# Patient Record
Sex: Female | Born: 1955 | Race: White | Hispanic: No | Smoking: Current every day smoker
Health system: Southern US, Community
[De-identification: ages and names within clinical notes are randomized; demographics above are authoritative.]

## PROBLEM LIST (undated history)

## (undated) DIAGNOSIS — I10 Essential (primary) hypertension: Secondary | ICD-10-CM

## (undated) DIAGNOSIS — J449 Chronic obstructive pulmonary disease, unspecified: Secondary | ICD-10-CM

## (undated) DIAGNOSIS — F419 Anxiety disorder, unspecified: Secondary | ICD-10-CM

## (undated) DIAGNOSIS — R569 Unspecified convulsions: Secondary | ICD-10-CM

## (undated) DIAGNOSIS — R011 Cardiac murmur, unspecified: Secondary | ICD-10-CM

## (undated) DIAGNOSIS — E7800 Pure hypercholesterolemia, unspecified: Secondary | ICD-10-CM

## (undated) DIAGNOSIS — J45909 Unspecified asthma, uncomplicated: Secondary | ICD-10-CM

## (undated) DIAGNOSIS — B009 Herpesviral infection, unspecified: Secondary | ICD-10-CM

## (undated) HISTORY — DX: Anxiety disorder, unspecified: F41.9

## (undated) HISTORY — PX: PARTIAL HYSTERECTOMY: SHX80

## (undated) HISTORY — PX: OTHER SURGICAL HISTORY: SHX169

## (undated) HISTORY — DX: Herpesviral infection, unspecified: B00.9

## (undated) HISTORY — DX: Chronic obstructive pulmonary disease, unspecified: J44.9

## (undated) HISTORY — PX: WISDOM TOOTH EXTRACTION: SHX21

## (undated) HISTORY — PX: TONSILLECTOMY AND ADENOIDECTOMY: SHX28

## (undated) HISTORY — DX: Cardiac murmur, unspecified: R01.1

## (undated) HISTORY — DX: Essential (primary) hypertension: I10

## (undated) HISTORY — DX: Unspecified convulsions: R56.9

## (undated) HISTORY — DX: Pure hypercholesterolemia, unspecified: E78.00

## (undated) HISTORY — DX: Unspecified asthma, uncomplicated: J45.909

---

## 1997-10-31 ENCOUNTER — Emergency Department (HOSPITAL_COMMUNITY): Admission: EM | Admit: 1997-10-31 | Discharge: 1997-10-31 | Payer: Self-pay | Admitting: Endocrinology

## 1997-11-02 ENCOUNTER — Encounter: Admission: RE | Admit: 1997-11-02 | Discharge: 1998-01-31 | Payer: Self-pay | Admitting: Internal Medicine

## 1997-11-02 ENCOUNTER — Encounter: Admission: RE | Admit: 1997-11-02 | Discharge: 1997-11-02 | Payer: Self-pay | Admitting: *Deleted

## 1997-12-04 ENCOUNTER — Encounter: Admission: RE | Admit: 1997-12-04 | Discharge: 1998-03-04 | Payer: Self-pay | Admitting: Specialist

## 2013-09-16 LAB — HM COLONOSCOPY

## 2015-12-14 DIAGNOSIS — J449 Chronic obstructive pulmonary disease, unspecified: Secondary | ICD-10-CM | POA: Diagnosis not present

## 2016-01-13 DIAGNOSIS — J449 Chronic obstructive pulmonary disease, unspecified: Secondary | ICD-10-CM | POA: Diagnosis not present

## 2016-01-20 DIAGNOSIS — I10 Essential (primary) hypertension: Secondary | ICD-10-CM | POA: Diagnosis not present

## 2016-01-20 DIAGNOSIS — M542 Cervicalgia: Secondary | ICD-10-CM | POA: Diagnosis not present

## 2016-02-13 DIAGNOSIS — J449 Chronic obstructive pulmonary disease, unspecified: Secondary | ICD-10-CM | POA: Diagnosis not present

## 2016-03-14 DIAGNOSIS — J449 Chronic obstructive pulmonary disease, unspecified: Secondary | ICD-10-CM | POA: Diagnosis not present

## 2016-03-21 DIAGNOSIS — F17210 Nicotine dependence, cigarettes, uncomplicated: Secondary | ICD-10-CM | POA: Diagnosis not present

## 2016-03-21 DIAGNOSIS — I10 Essential (primary) hypertension: Secondary | ICD-10-CM | POA: Diagnosis not present

## 2016-03-21 DIAGNOSIS — Z23 Encounter for immunization: Secondary | ICD-10-CM | POA: Diagnosis not present

## 2016-03-21 DIAGNOSIS — R7301 Impaired fasting glucose: Secondary | ICD-10-CM | POA: Diagnosis not present

## 2016-03-21 DIAGNOSIS — E785 Hyperlipidemia, unspecified: Secondary | ICD-10-CM | POA: Diagnosis not present

## 2016-04-14 DIAGNOSIS — J449 Chronic obstructive pulmonary disease, unspecified: Secondary | ICD-10-CM | POA: Diagnosis not present

## 2016-09-21 DIAGNOSIS — F3340 Major depressive disorder, recurrent, in remission, unspecified: Secondary | ICD-10-CM | POA: Diagnosis not present

## 2016-09-21 DIAGNOSIS — Z0000 Encounter for general adult medical examination without abnormal findings: Secondary | ICD-10-CM | POA: Diagnosis not present

## 2016-09-21 DIAGNOSIS — R5383 Other fatigue: Secondary | ICD-10-CM | POA: Diagnosis not present

## 2016-09-21 DIAGNOSIS — Z13220 Encounter for screening for lipoid disorders: Secondary | ICD-10-CM | POA: Diagnosis not present

## 2016-09-21 DIAGNOSIS — G40909 Epilepsy, unspecified, not intractable, without status epilepticus: Secondary | ICD-10-CM | POA: Diagnosis not present

## 2016-09-21 DIAGNOSIS — R7301 Impaired fasting glucose: Secondary | ICD-10-CM | POA: Diagnosis not present

## 2016-09-21 LAB — COMPLETE METABOLIC PANEL WITH GFR
CO2: 30
Calcium: 10.3
Chloride: 100
Total Protein: 7
eGFR: >60

## 2016-09-21 LAB — BASIC METABOLIC PANEL
BUN: 14 (ref 4–21)
CREATININE: 0.6 (ref 0.5–1.1)
GLUCOSE: 86
POTASSIUM: 4.5 (ref 3.4–5.3)
Sodium: 141 (ref 137–147)

## 2016-09-21 LAB — HEPATIC FUNCTION PANEL
ALT: 16 (ref 7–35)
AST: 17 (ref 13–35)
Alkaline Phosphatase: 75 (ref 25–125)
BILIRUBIN, TOTAL: 0.9

## 2016-09-21 LAB — URINALYSIS COMPLETE WITH MICROSCOPIC (ARMC ONLY)
BILIRUBIN (URINE): NEGATIVE
Ketones, urine: NEGATIVE
Protein, Urine: NEGATIVE
SPECIFIC GRAVITY: 1.015
URINE GLUCOSE: NEGATIVE
pH, Urine: 7

## 2016-09-21 LAB — CBC AND DIFFERENTIAL
HCT: 45 (ref 36–46)
HEMOGLOBIN: 15.8 (ref 12.0–16.0)
Platelets: 276 (ref 150–399)
WBC: 8.9

## 2016-09-21 LAB — LIPID PANEL
Cholesterol: 134 (ref 0–200)
HDL: 45 (ref 35–70)
LDL Cholesterol: 66
Triglycerides: 116 (ref 40–160)

## 2016-09-21 LAB — HEMOGLOBIN A1C: HEMOGLOBIN A1C: 5.7

## 2016-09-28 DIAGNOSIS — M1711 Unilateral primary osteoarthritis, right knee: Secondary | ICD-10-CM | POA: Diagnosis not present

## 2016-09-28 DIAGNOSIS — G8929 Other chronic pain: Secondary | ICD-10-CM | POA: Diagnosis not present

## 2016-09-28 DIAGNOSIS — M25561 Pain in right knee: Secondary | ICD-10-CM | POA: Diagnosis not present

## 2017-01-19 DIAGNOSIS — R300 Dysuria: Secondary | ICD-10-CM | POA: Diagnosis not present

## 2017-01-19 DIAGNOSIS — L02219 Cutaneous abscess of trunk, unspecified: Secondary | ICD-10-CM | POA: Diagnosis not present

## 2017-01-19 DIAGNOSIS — Z23 Encounter for immunization: Secondary | ICD-10-CM | POA: Diagnosis not present

## 2017-01-19 DIAGNOSIS — I10 Essential (primary) hypertension: Secondary | ICD-10-CM | POA: Diagnosis not present

## 2017-01-19 DIAGNOSIS — N952 Postmenopausal atrophic vaginitis: Secondary | ICD-10-CM | POA: Diagnosis not present

## 2017-06-15 DIAGNOSIS — I10 Essential (primary) hypertension: Secondary | ICD-10-CM | POA: Diagnosis not present

## 2017-06-15 DIAGNOSIS — H6122 Impacted cerumen, left ear: Secondary | ICD-10-CM | POA: Diagnosis not present

## 2017-06-15 DIAGNOSIS — R6889 Other general symptoms and signs: Secondary | ICD-10-CM | POA: Diagnosis not present

## 2017-10-03 ENCOUNTER — Encounter: Payer: Self-pay | Admitting: Physician Assistant

## 2017-10-03 ENCOUNTER — Ambulatory Visit (INDEPENDENT_AMBULATORY_CARE_PROVIDER_SITE_OTHER): Payer: BLUE CROSS/BLUE SHIELD | Admitting: Physician Assistant

## 2017-10-03 VITALS — BP 107/65 | HR 76 | Temp 98.3°F | Resp 16 | Ht 67.75 in | Wt 242.0 lb

## 2017-10-03 DIAGNOSIS — R0602 Shortness of breath: Secondary | ICD-10-CM | POA: Diagnosis not present

## 2017-10-03 DIAGNOSIS — F339 Major depressive disorder, recurrent, unspecified: Secondary | ICD-10-CM

## 2017-10-03 DIAGNOSIS — E559 Vitamin D deficiency, unspecified: Secondary | ICD-10-CM

## 2017-10-03 DIAGNOSIS — I10 Essential (primary) hypertension: Secondary | ICD-10-CM

## 2017-10-03 DIAGNOSIS — Z0000 Encounter for general adult medical examination without abnormal findings: Secondary | ICD-10-CM | POA: Diagnosis not present

## 2017-10-03 DIAGNOSIS — Z8249 Family history of ischemic heart disease and other diseases of the circulatory system: Secondary | ICD-10-CM | POA: Diagnosis not present

## 2017-10-03 DIAGNOSIS — E7800 Pure hypercholesterolemia, unspecified: Secondary | ICD-10-CM | POA: Diagnosis not present

## 2017-10-03 DIAGNOSIS — J42 Unspecified chronic bronchitis: Secondary | ICD-10-CM

## 2017-10-03 DIAGNOSIS — E669 Obesity, unspecified: Secondary | ICD-10-CM

## 2017-10-03 DIAGNOSIS — J449 Chronic obstructive pulmonary disease, unspecified: Secondary | ICD-10-CM | POA: Insufficient documentation

## 2017-10-03 DIAGNOSIS — M79605 Pain in left leg: Secondary | ICD-10-CM

## 2017-10-03 DIAGNOSIS — R600 Localized edema: Secondary | ICD-10-CM

## 2017-10-03 DIAGNOSIS — D369 Benign neoplasm, unspecified site: Secondary | ICD-10-CM | POA: Diagnosis not present

## 2017-10-03 DIAGNOSIS — R0601 Orthopnea: Secondary | ICD-10-CM

## 2017-10-03 DIAGNOSIS — J302 Other seasonal allergic rhinitis: Secondary | ICD-10-CM

## 2017-10-03 DIAGNOSIS — H6123 Impacted cerumen, bilateral: Secondary | ICD-10-CM | POA: Diagnosis not present

## 2017-10-03 DIAGNOSIS — Z720 Tobacco use: Secondary | ICD-10-CM

## 2017-10-03 MED ORDER — VARENICLINE TARTRATE 0.5 MG X 11 & 1 MG X 42 PO MISC: ORAL | 0 refills | Status: DC

## 2017-10-03 MED ORDER — FLUTICASONE-UMECLIDIN-VILANT 100-62.5-25 MCG/INH IN AEPB: 1.0000 | INHALATION_SPRAY | Freq: Every day | RESPIRATORY_TRACT | 2 refills | Status: DC

## 2017-10-03 NOTE — Patient Instructions (Addendum)
Echo/labs  Stop symbicort and start trelegy once daily.   Start chantix.   CT scan low dose screening.will hold on.   Debrox 5-10 drops each ear.

## 2017-10-03 NOTE — Progress Notes (Signed)
Subjective:    Patient ID: Ashley Scott, female    DOB: 11-04-1955, 62 y.o.   MRN: [id number]  HPI  Pt is a 62 yo obese female with COPD, MDD, HTN who presents to the clinic to establish care and discuss concerns.   She does want to quit smoking. She has tried chantix in the past and worked well with no side effects. She became stressed one day and started smoking back. She has smoked for at least 42 years at least 1 pack a day. Her COPD is not necessarily controlled. She get SOB easily. She has trouble going to [employer]/shopping without getting easily tired. She is taking symbicort and does help. She is having some new intermittent swelling or bilateral lower extremities. She has a family hx with mother and father with CHF. She admits to sleeping on multiple pillows at night. Overall she has problems going to sleep as well. Hemp oil helps a lot.   Pt mentions some cold, numbing sensations in the leg intermittently but mostly at night. Feels like "cold water being poured on the lateral outside of leg". Mostly right leg but left as well. She admits to cramps.   She does feel like something is draining out of her ears and they feel clogged.   .. Active Ambulatory Problems    Diagnosis Date Noted  . COPD (chronic obstructive pulmonary disease) ([place]) 10/03/2017  . Hypercholesteremia 10/03/2017  . Adenomatous polyp 10/03/2017  . Family history of heart failure 10/04/2017  . SOB (shortness of breath) on exertion 10/04/2017  . Tobacco abuse 10/04/2017  . Bilateral impacted cerumen 10/04/2017  . Vitamin D insufficiency 10/04/2017  . Essential hypertension 10/04/2017  . Depression, recurrent ([place]) 10/04/2017  . Seasonal allergies 10/04/2017  . Obesity (BMI 30-39.9) 10/04/2017   Resolved Ambulatory Problems    Diagnosis Date Noted  . No Resolved Ambulatory Problems   Past Medical History:  Diagnosis Date  . Anxiety   . Asthma   . COPD (chronic obstructive pulmonary disease) ([place])   .  Herpes   . Hypercholesteremia   . Hypertension   . Seizure-like activity ([place])    .Marland Kitchen Family History  Problem Relation Age of Onset  . Stomach cancer Mother   . Brain cancer Mother   . Hypertension Mother   . Hypercholesterolemia Mother   . Hypercholesterolemia Father   . Diabetes Father   . Hypertension Father   . Liver disease Sister   . Heart attack Maternal Grandfather   . Brain cancer Maternal Grandfather   . Breast cancer Maternal Aunt    .Marland Kitchen Social History   Socioeconomic History  . Marital status: Divorced    Spouse name: Not on file  . Number of children: Not on file  . Years of education: Not on file  . Highest education level: Not on file  Occupational History  . Not on file  Social Needs  . Financial resource strain: Not on file  . Food insecurity:    Worry: Not on file    Inability: Not on file  . Transportation needs:    Medical: Not on file    Non-medical: Not on file  Tobacco Use  . Smoking status: Current Every Day Smoker    Packs/day: 1.00    Types: Cigarettes  . Smokeless tobacco: Never Used  Substance and Sexual Activity  . Alcohol use: Yes    Comment: very rarely  . Drug use: Never  . Sexual activity: Not Currently  Lifestyle  .  Physical activity:    Days per week: Not on file    Minutes per session: Not on file  . Stress: Not on file  Relationships  . Social connections:    Talks on phone: Not on file    Gets together: Not on file    Attends religious service: Not on file    Active member of club or organization: Not on file    Attends meetings of clubs or organizations: Not on file    Relationship status: Not on file  . Intimate partner violence:    Fear of current or ex partner: Not on file    Emotionally abused: Not on file    Physically abused: Not on file    Forced sexual activity: Not on file  Other Topics Concern  . Not on file  Social History Narrative  . Not on file     Review of Systems See HPI.     Objective:    Physical Exam  Constitutional: She is oriented to person, place, and time. She appears well-developed and well-nourished.  HENT:  Head: Normocephalic and atraumatic.  Nose: Nose normal.  Mouth/Throat: Oropharynx is clear and moist.  Bilateral cerumen impaction.   Eyes: Conjunctivae and EOM are normal.  Neck: Normal range of motion.  Cardiovascular: Normal rate and regular rhythm.  Pulmonary/Chest: Effort normal.  Coarse breath sounds bilateral lungs.   Neurological: She is alert and oriented to person, place, and time.  Skin:  Bilateral scant edema.           Assessment & Plan:   .Ashley Scott was seen today for new patient (initial visit), requests chantix medication and possible med refills.  Diagnoses and all orders for this visit:  Chronic bronchitis, unspecified chronic bronchitis type ([place]) -     Fluticasone-Umeclidin-Vilant (TRELEGY ELLIPTA) 100-62.5-25 MCG/INH AEPB; Inhale 1 puff into the lungs daily.  Hypercholesteremia -     Lipid Panel w/reflex Direct LDL  Family history of heart failure -     Brain natriuretic peptide  SOB (shortness of breath) on exertion -     COMPLETE METABOLIC PANEL WITH GFR -     Brain natriuretic peptide  Adenomatous polyp  Preventative health care -     COMPLETE METABOLIC PANEL WITH GFR -     Brain natriuretic peptide -     Lipid Panel w/reflex Direct LDL -     TSH  Tobacco abuse -     varenicline (CHANTIX STARTING MONTH PAK) 0.5 MG X 11 & 1 MG X 42 tablet; Take one 0.5mg  tablet by mouth once daily for 3 days, then increase to one 0.5mg  tablet twice daily for 3 days, then increase to one 1mg  tablet twice daily.  Bilateral impacted cerumen  Essential hypertension  Vitamin D insufficiency  Depression, recurrent ([facility])  Seasonal allergies   .Marland Kitchen Depression screen PHQ 2/9 10/08/2017  Decreased Interest 2  Down, Depressed, Hopeless 2  PHQ - 2 Score 4  Altered sleeping 3  Tired, decreased energy 3  Change in appetite 1   Feeling bad or failure about yourself  2  Trouble concentrating 1  Moving slowly or fidgety/restless 0  Suicidal thoughts 0  PHQ-9 Score 14  Difficult doing work/chores Somewhat difficult   Started chantix to quit smoking. Start the taper and be stopped smoking in 1 month. Will likely continue chantix for 3-6 months.  Stop symbicort and start trelegy to see if improves cOPD symptoms. consider spirometry in the future.    Due  to SOB/bilateral lower ext/edema/orthopnea will get ECHO and BNP.   Left leg pain could be neuropathy/radicular pain or perhaps could be some PVD will hx. Will continue to work up. Lipid ordered today.   Pt declined irrigation today. Discussed debrox to use for next few weeks and let me know if has any problems.   Discussed may need close follow up over the next 6 months to get her in a better place.

## 2017-10-04 ENCOUNTER — Encounter: Payer: Self-pay | Admitting: Physician Assistant

## 2017-10-04 DIAGNOSIS — R0602 Shortness of breath: Secondary | ICD-10-CM | POA: Insufficient documentation

## 2017-10-04 DIAGNOSIS — I10 Essential (primary) hypertension: Secondary | ICD-10-CM | POA: Insufficient documentation

## 2017-10-04 DIAGNOSIS — F17200 Nicotine dependence, unspecified, uncomplicated: Secondary | ICD-10-CM | POA: Insufficient documentation

## 2017-10-04 DIAGNOSIS — Z8249 Family history of ischemic heart disease and other diseases of the circulatory system: Secondary | ICD-10-CM | POA: Insufficient documentation

## 2017-10-04 DIAGNOSIS — J302 Other seasonal allergic rhinitis: Secondary | ICD-10-CM | POA: Insufficient documentation

## 2017-10-04 DIAGNOSIS — E559 Vitamin D deficiency, unspecified: Secondary | ICD-10-CM | POA: Insufficient documentation

## 2017-10-04 DIAGNOSIS — H6123 Impacted cerumen, bilateral: Secondary | ICD-10-CM | POA: Insufficient documentation

## 2017-10-04 DIAGNOSIS — E669 Obesity, unspecified: Secondary | ICD-10-CM | POA: Insufficient documentation

## 2017-10-04 DIAGNOSIS — F339 Major depressive disorder, recurrent, unspecified: Secondary | ICD-10-CM | POA: Insufficient documentation

## 2017-10-04 LAB — COMPLETE METABOLIC PANEL WITH GFR
AG RATIO: 2 (calc) (ref 1.0–2.5)
ALT: 13 U/L (ref 6–29)
AST: 16 U/L (ref 10–35)
Albumin: 4.3 g/dL (ref 3.6–5.1)
Alkaline phosphatase (APISO): 64 U/L (ref 33–130)
BILIRUBIN TOTAL: 0.6 mg/dL (ref 0.2–1.2)
BUN: 8 mg/dL (ref 7–25)
CHLORIDE: 100 mmol/L (ref 98–110)
CO2: 30 mmol/L (ref 20–32)
Calcium: 9.6 mg/dL (ref 8.6–10.4)
Creat: 0.65 mg/dL (ref 0.50–0.99)
GFR, EST AFRICAN AMERICAN: 110 mL/min/{1.73_m2} (ref >=60)
GFR, Est Non African American: 95 mL/min/{1.73_m2} (ref >=60)
Globulin: 2.2 g/dL (calc) (ref 1.9–3.7)
Glucose, Bld: 104 mg/dL — ABNORMAL HIGH (ref 65–99)
POTASSIUM: 4.5 mmol/L (ref 3.5–5.3)
SODIUM: 136 mmol/L (ref 135–146)
TOTAL PROTEIN: 6.5 g/dL (ref 6.1–8.1)

## 2017-10-04 LAB — TSH: TSH: 1.11 mIU/L (ref 0.40–4.50)

## 2017-10-04 LAB — LIPID PANEL W/REFLEX DIRECT LDL
CHOLESTEROL: 110 mg/dL (ref <200)
HDL: 40 mg/dL — AB (ref >50)
LDL Cholesterol (Calc): 55 mg/dL (calc)
NON-HDL CHOLESTEROL (CALC): 70 mg/dL (ref <130)
TRIGLYCERIDES: 72 mg/dL (ref <150)
Total CHOL/HDL Ratio: 2.8 (calc) (ref <5.0)

## 2017-10-04 LAB — BRAIN NATRIURETIC PEPTIDE: BRAIN NATRIURETIC PEPTIDE: 40 pg/mL (ref <100)

## 2017-10-05 NOTE — Progress Notes (Signed)
Call pt: fasting glucose a little elevated. We can do an a1c at next visit.  BNP normal.  Cholesterol looks good. HDL could be better. Stopping smoking and exercise could help with this.  Thyroid normal.

## 2017-10-08 DIAGNOSIS — M79605 Pain in left leg: Secondary | ICD-10-CM | POA: Insufficient documentation

## 2017-10-08 DIAGNOSIS — R0601 Orthopnea: Secondary | ICD-10-CM | POA: Insufficient documentation

## 2017-10-08 DIAGNOSIS — R600 Localized edema: Secondary | ICD-10-CM | POA: Insufficient documentation

## 2017-10-09 ENCOUNTER — Encounter: Payer: Self-pay | Admitting: Physician Assistant

## 2017-10-11 ENCOUNTER — Ambulatory Visit (HOSPITAL_BASED_OUTPATIENT_CLINIC_OR_DEPARTMENT_OTHER)
Admission: RE | Admit: 2017-10-11 | Discharge: 2017-10-11 | Disposition: A | Discharge status: Home / Self Care | Payer: BLUE CROSS/BLUE SHIELD | Source: Ambulatory Visit | Attending: Physician Assistant | Admitting: Physician Assistant

## 2017-10-11 DIAGNOSIS — I10 Essential (primary) hypertension: Secondary | ICD-10-CM | POA: Diagnosis not present

## 2017-10-11 DIAGNOSIS — R0601 Orthopnea: Secondary | ICD-10-CM

## 2017-10-11 DIAGNOSIS — Z8249 Family history of ischemic heart disease and other diseases of the circulatory system: Secondary | ICD-10-CM

## 2017-10-11 DIAGNOSIS — R0602 Shortness of breath: Secondary | ICD-10-CM | POA: Diagnosis not present

## 2017-10-11 DIAGNOSIS — J449 Chronic obstructive pulmonary disease, unspecified: Secondary | ICD-10-CM | POA: Insufficient documentation

## 2017-10-11 DIAGNOSIS — Z720 Tobacco use: Secondary | ICD-10-CM | POA: Diagnosis not present

## 2017-10-11 DIAGNOSIS — J42 Unspecified chronic bronchitis: Secondary | ICD-10-CM

## 2017-10-11 NOTE — Progress Notes (Signed)
  Echocardiogram 2D Echocardiogram has been performed.  Ashley Scott 10/11/2017, 4:00 PM

## 2017-10-16 ENCOUNTER — Encounter: Payer: Self-pay | Admitting: Physician Assistant

## 2017-10-16 DIAGNOSIS — I071 Rheumatic tricuspid insufficiency: Secondary | ICD-10-CM | POA: Insufficient documentation

## 2017-10-16 NOTE — Progress Notes (Signed)
Call pt: EF is great. Even more than we usually see. You do have some trivial regurgitation of the tricuspid valve and some left ventricle relaxation.   Overall echo looks good.

## 2017-11-02 ENCOUNTER — Encounter: Payer: Self-pay | Admitting: Physician Assistant

## 2017-11-02 ENCOUNTER — Ambulatory Visit (INDEPENDENT_AMBULATORY_CARE_PROVIDER_SITE_OTHER): Payer: BLUE CROSS/BLUE SHIELD | Admitting: Physician Assistant

## 2017-11-02 VITALS — BP 109/79 | HR 80 | Ht 67.75 in | Wt 240.0 lb

## 2017-11-02 DIAGNOSIS — Z1231 Encounter for screening mammogram for malignant neoplasm of breast: Secondary | ICD-10-CM

## 2017-11-02 DIAGNOSIS — N3281 Overactive bladder: Secondary | ICD-10-CM | POA: Diagnosis not present

## 2017-11-02 DIAGNOSIS — F17200 Nicotine dependence, unspecified, uncomplicated: Secondary | ICD-10-CM

## 2017-11-02 DIAGNOSIS — Z23 Encounter for immunization: Secondary | ICD-10-CM

## 2017-11-02 DIAGNOSIS — Z0000 Encounter for general adult medical examination without abnormal findings: Secondary | ICD-10-CM | POA: Diagnosis not present

## 2017-11-02 DIAGNOSIS — E6609 Other obesity due to excess calories: Secondary | ICD-10-CM

## 2017-11-02 DIAGNOSIS — Z6835 Body mass index (BMI) 35.0-35.9, adult: Secondary | ICD-10-CM

## 2017-11-02 MED ORDER — VARENICLINE TARTRATE 1 MG PO TABS: 1.0000 mg | ORAL_TABLET | Freq: Two times a day (BID) | ORAL | 2 refills | Status: DC

## 2017-11-02 MED ORDER — MIRABEGRON ER 50 MG PO TB24: 50.0000 mg | ORAL_TABLET | Freq: Every day | ORAL | 5 refills | Status: DC

## 2017-11-02 NOTE — Progress Notes (Signed)
Subjective:     Ashley Scott is a 62 y.o. female here for a physical routine.  Personal health questionnaire reviewed: yes.   Current complaints:  COPD: SOB has improved much better since last visit on 10/03/2017. Pt was on symbicort and was having SOB w/ walking. She was put on trelegy and reports significant improved in her breathing with ambulation. No side effects or concerns.   Urge and stress urinary Incontinence: She says this has been going for years, however it is getting worse. Had a bladder scan 2 yrs ago and was normal. Denies heamturia, abdominal pain or any other symptoms. Had tried kegel exercises but have not been helpful. She has to get up several times to get up to use the bathroom. She is interested in trying medicine for this. She has been using pads but has made it hard to work.   R. Groin pain: Pt has been having intermittent R. Groin pain for 1 month. It has happened 3 times. It is a sharp pain that lasts seconds. No radiation of pain. No aggravating or alleviating factors. No pain w/ ambulation or palpation. She has no tried anything for this. Denies any trauma or skin changes. Pt has a hx of OA of both knees.   Smoking: Have gone down to 1/2 pack a day of smoking when on Chantix. Pt has been on Chantix for 1 month. No side effects.  Gynecologic History No LMP recorded. Last mammogram: 11/05/2014. Results were: normal  Obstetric History OB History  No data available   The following portions of the patient's history were reviewed and updated as appropriate: allergies, past social history and problem list.  Review of Systems Constitutional: negative Eyes: negative Ears, nose, mouth, throat, and face: negative Respiratory: negative Cardiovascular: negative Gastrointestinal: positive for constipation Genitourinary:positive for urinary incontinence Integument/breast: negative Musculoskeletal:positive for myalgias Neurological: negative Behavioral/Psych: negative     Objective:    BP 109/79   Pulse 80   Ht 5' 7.75" (1.721 m)   Wt 240 lb (108.9 kg)   BMI 36.76 kg/m   General Appearance:    Alert, cooperative, no distress, appears stated age  Head:    Normocephalic, without obvious abnormality, atraumatic  Eyes:    PERRL, conjunctiva/corneas clear, EOM's intact, fundi    benign, both eyes  Ears:    Normal TM's and external ear canals, both ears. Mild cerumen bilateral  Nose:   Nares normal, septum midline, mucosa normal, no drainage    or sinus tenderness  Throat:   Lips, mucosa, and tongue normal; teeth and gums normal  Neck:   Supple, symmetrical, trachea midline, no adenopathy;    thyroid:  no enlargement/tenderness/nodules; no carotid   bruit or JVD  Back:     Symmetric, no curvature, ROM normal, no CVA tenderness  Lungs:     Mild expiratory wheezing, respirations unlabored  Chest Wall:    No tenderness or deformity   Heart:    Regular rate and rhythm, S1 and S2 normal, no murmur, rub   or gallop     Abdomen:     Soft, non-tender, bowel sounds active all four quadrants,    no masses, no organomegaly        Extremities:  Varicose veins noted on bilateral legs, no cyanosis, mild +1 edema noted of right lower extremity. ROM of hip normal without pain.   Pulses:   2+ and symmetric all extremities  Skin:   Skin color, texture, turgor normal   Lymph nodes:  Cervical, supraclavicular, and axillary nodes normal  Neurologic:   CNII-XII intact, normal strength, sensation and reflexes    throughout      Assessment:    Healthy female exam.    Plan:   [name] was seen today for annual exam.  Diagnoses and all orders for this visit:  Routine physical examination  Visit for screening mammogram -     MM 3D SCREEN BREAST BILATERAL  Need for tetanus booster  Need for Tdap vaccination -     Tdap vaccine greater than or equal to 7yo IM  OAB (overactive bladder) -     mirabegron ER (MYRBETRIQ) 50 MG TB24 tablet; Take 1 tablet (50 mg total)  by mouth daily.  Current smoker -     varenicline (CHANTIX) 1 MG tablet; Take 1 tablet (1 mg total) by mouth 2 (two) times daily.   Vitals:   11/02/17 0906  BP: 109/79  Pulse: 80  Weight: 240 lb (108.9 kg)  Height: 5' 7.75" (1.721 m)   -Vitals look great. Pt has lost 2 lbs. Congratulated for this. Uptodate on labs. Labs looked good. Last done 10/2017. Fasting glucose was mildy elevated. Will do an A1c during 3 month follow. Pt declined A1c at this time. Will follow up in 3 months. Will get A1c and Hep c test during that visit. Encouraged to limit carbs/sweets. Encouraged 150 min of excersice daily. Drink 1.5 oz of water daily.   -Urinary incontinence- Pt encouraged to continue doing kegel exercises. Will order Mirabergron due to least side effect profile. Side effect discussed. Pt was on medication for this before but unsure of what medicine. Will follow up at DM appt.   -Right groin pain-  Likely arthritis vs myalgia. No pain w/ ambulation or tenderness to palpation. No trauma. Hx of OA of bilateral knees. Hip exercises HO given. Pt declined anti-inflammatories for now. Wants to try tumeric. Will follow up as needed for this.  -COPD- Pt doing really well on Trelegy. SOB improved w/ walking and exercise.   Constipation- Encouraged to drink 1.5 oz water daily. Increase fiber in diet. Metamucil and probiotic discussed. No hematochezia or abdominal pain.   Smoking- Pt told to try to stop smoking within 1-2 weeks. Goal stop smoking within 1 month of chantix. Pt voiced understanding. Has cut smoking to 1/2 ppd.   -Tetanus done. Mammogram ordered. Uptodate on colonscopy.

## 2017-11-02 NOTE — Patient Instructions (Addendum)
consider tumeric  Health Maintenance, Female Adopting a healthy lifestyle and getting preventive care can go a long way to promote health and wellness. Talk with your health care provider about what schedule of regular examinations is right for you. This is a good chance for you to check in with your provider about disease prevention and staying healthy. In between checkups, there are plenty of things you can do on your own. Experts have done a lot of research about which lifestyle changes and preventive measures are most likely to keep you healthy. Ask your health care provider for more information. Weight and diet Eat a healthy diet  Be sure to include plenty of vegetables, fruits, low-fat dairy products, and lean protein.  Do not eat a lot of foods high in solid fats, added sugars, or salt.  Get regular exercise. This is one of the most important things you can do for your health. ? Most adults should exercise for at least 150 minutes each week. The exercise should increase your heart rate and make you sweat (moderate-intensity exercise). ? Most adults should also do strengthening exercises at least twice a week. This is in addition to the moderate-intensity exercise.  Maintain a healthy weight  Body mass index (BMI) is a measurement that can be used to identify possible weight problems. It estimates body fat based on height and weight. Your health care provider can help determine your BMI and help you achieve or maintain a healthy weight.  For females 77 years of age and older: ? A BMI below 18.5 is considered underweight. ? A BMI of 18.5 to 24.9 is normal. ? A BMI of 25 to 29.9 is considered overweight. ? A BMI of 30 and above is considered obese.  Watch levels of cholesterol and blood lipids  You should start having your blood tested for lipids and cholesterol at 62 years of age, then have this test every 5 years.  You may need to have your cholesterol levels checked more often  if: ? Your lipid or cholesterol levels are high. ? You are older than 62 years of age. ? You are at high risk for heart disease.  Cancer screening Lung Cancer  Lung cancer screening is recommended for adults 6-73 years old who are at high risk for lung cancer because of a history of smoking.  A yearly low-dose CT scan of the lungs is recommended for people who: ? Currently smoke. ? Have quit within the past 15 years. ? Have at least a 30-pack-year history of smoking. A pack year is smoking an average of one pack of cigarettes a day for 1 year.  Yearly screening should continue until it has been 15 years since you quit.  Yearly screening should stop if you develop a health problem that would prevent you from having lung cancer treatment.  Breast Cancer  Practice breast self-awareness. This means understanding how your breasts normally appear and feel.  It also means doing regular breast self-exams. Let your health care provider know about any changes, no matter how small.  If you are in your 20s or 30s, you should have a clinical breast exam (CBE) by a health care provider every 1-3 years as part of a regular health exam.  If you are 32 or older, have a CBE every year. Also consider having a breast X-ray (mammogram) every year.  If you have a family history of breast cancer, talk to your health care provider about genetic screening.  If you are  at high risk for breast cancer, talk to your health care provider about having an MRI and a mammogram every year.  Breast cancer gene (BRCA) assessment is recommended for women who have family members with BRCA-related cancers. BRCA-related cancers include: ? Breast. ? Ovarian. ? Tubal. ? Peritoneal cancers.  Results of the assessment will determine the need for genetic counseling and BRCA1 and BRCA2 testing.  Cervical Cancer Your health care provider may recommend that you be screened regularly for cancer of the pelvic organs  (ovaries, uterus, and vagina). This screening involves a pelvic examination, including checking for microscopic changes to the surface of your cervix (Pap test). You may be encouraged to have this screening done every 3 years, beginning at age 74.  For women ages 66-65, health care providers may recommend pelvic exams and Pap testing every 3 years, or they may recommend the Pap and pelvic exam, combined with testing for human papilloma virus (HPV), every 5 years. Some types of HPV increase your risk of cervical cancer. Testing for HPV may also be done on women of any age with unclear Pap test results.  Other health care providers may not recommend any screening for nonpregnant women who are considered low risk for pelvic cancer and who do not have symptoms. Ask your health care provider if a screening pelvic exam is right for you.  If you have had past treatment for cervical cancer or a condition that could lead to cancer, you need Pap tests and screening for cancer for at least 20 years after your treatment. If Pap tests have been discontinued, your risk factors (such as having a new sexual partner) need to be reassessed to determine if screening should resume. Some women have medical problems that increase the chance of getting cervical cancer. In these cases, your health care provider may recommend more frequent screening and Pap tests.  Colorectal Cancer  This type of cancer can be detected and often prevented.  Routine colorectal cancer screening usually begins at 62 years of age and continues through 62 years of age.  Your health care provider may recommend screening at an earlier age if you have risk factors for colon cancer.  Your health care provider may also recommend using home test kits to check for hidden blood in the stool.  A small camera at the end of a tube can be used to examine your colon directly (sigmoidoscopy or colonoscopy). This is done to check for the earliest forms of  colorectal cancer.  Routine screening usually begins at age 38.  Direct examination of the colon should be repeated every 5-10 years through 62 years of age. However, you may need to be screened more often if early forms of precancerous polyps or small growths are found.  Skin Cancer  Check your skin from head to toe regularly.  Tell your health care provider about any new moles or changes in moles, especially if there is a change in a mole's shape or color.  Also tell your health care provider if you have a mole that is larger than the size of a pencil eraser.  Always use sunscreen. Apply sunscreen liberally and repeatedly throughout the day.  Protect yourself by wearing long sleeves, pants, a wide-brimmed hat, and sunglasses whenever you are outside.  Heart disease, diabetes, and high blood pressure  High blood pressure causes heart disease and increases the risk of stroke. High blood pressure is more likely to develop in: ? People who have blood pressure in the  in the high end of the normal range (130-139/85-89 mm Hg). ? People who are overweight or obese. ? People who are African American.  If you are 18-39 years of age, have your blood pressure checked every 3-5 years. If you are 40 years of age or older, have your blood pressure checked every year. You should have your blood pressure measured twice-once when you are at a hospital or clinic, and once when you are not at a hospital or clinic. Record the average of the two measurements. To check your blood pressure when you are not at a hospital or clinic, you can use: ? An automated blood pressure machine at a pharmacy. ? A home blood pressure monitor.  If you are between 55 years and 79 years old, ask your health care provider if you should take aspirin to prevent strokes.  Have regular diabetes screenings. This involves taking a blood sample to check your fasting blood sugar level. ? If you are at a normal weight and have a low risk  for diabetes, have this test once every three years after 62 years of age. ? If you are overweight and have a high risk for diabetes, consider being tested at a younger age or more often. Preventing infection Hepatitis B  If you have a higher risk for hepatitis B, you should be screened for this virus. You are considered at high risk for hepatitis B if: ? You were born in a country where hepatitis B is common. Ask your health care provider which countries are considered high risk. ? Your parents were born in a high-risk country, and you have not been immunized against hepatitis B (hepatitis B vaccine). ? You have HIV or AIDS. ? You use needles to inject street drugs. ? You live with someone who has hepatitis B. ? You have had sex with someone who has hepatitis B. ? You get hemodialysis treatment. ? You take certain medicines for conditions, including cancer, organ transplantation, and autoimmune conditions.  Hepatitis C  Blood testing is recommended for: ? Everyone born from 1945 through 1965. ? Anyone with known risk factors for hepatitis C.  Sexually transmitted infections (STIs)  You should be screened for sexually transmitted infections (STIs) including gonorrhea and chlamydia if: ? You are sexually active and are younger than 62 years of age. ? You are older than 62 years of age and your health care provider tells you that you are at risk for this type of infection. ? Your sexual activity has changed since you were last screened and you are at an increased risk for chlamydia or gonorrhea. Ask your health care provider if you are at risk.  If you do not have HIV, but are at risk, it may be recommended that you take a prescription medicine daily to prevent HIV infection. This is called pre-exposure prophylaxis (PrEP). You are considered at risk if: ? You are sexually active and do not regularly use condoms or know the HIV status of your partner(s). ? You take drugs by  injection. ? You are sexually active with a partner who has HIV.  Talk with your health care provider about whether you are at high risk of being infected with HIV. If you choose to begin PrEP, you should first be tested for HIV. You should then be tested every 3 months for as long as you are taking PrEP. Pregnancy  If you are premenopausal and you may become pregnant, ask your health care provider about preconception counseling.    you may become pregnant, take 400 to 800 micrograms (mcg) of folic acid every day.  If you want to prevent pregnancy, talk to your health care provider about birth control (contraception). Osteoporosis and menopause  Osteoporosis is a disease in which the bones lose minerals and strength with aging. This can result in serious bone fractures. Your risk for osteoporosis can be identified using a bone density scan.  If you are 65 years of age or older, or if you are at risk for osteoporosis and fractures, ask your health care provider if you should be screened.  Ask your health care provider whether you should take a calcium or vitamin D supplement to lower your risk for osteoporosis.  Menopause may have certain physical symptoms and risks.  Hormone replacement therapy may reduce some of these symptoms and risks. Talk to your health care provider about whether hormone replacement therapy is right for you. Follow these instructions at home:  Schedule regular health, dental, and eye exams.  Stay current with your immunizations.  Do not use any tobacco products including cigarettes, chewing tobacco, or electronic cigarettes.  If you are pregnant, do not drink alcohol.  If you are breastfeeding, limit how much and how often you drink alcohol.  Limit alcohol intake to no more than 1 drink per day for nonpregnant women. One drink equals 12 ounces of beer, 5 ounces of wine, or 1 ounces of hard liquor.  Do not use street drugs.  Do not share needles.  Ask  your health care provider for help if you need support or information about quitting drugs.  Tell your health care provider if you often feel depressed.  Tell your health care provider if you have ever been abused or do not feel safe at home. This information is not intended to replace advice given to you by your health care provider. Make sure you discuss any questions you have with your health care provider. Document Released: 10/03/2010 Document Revised: 08/26/2015 Document Reviewed: 12/22/2014 Elsevier Interactive Patient Education  2018 [employer].    Hip Exercises Ask your health care provider which exercises are safe for you. Do exercises exactly as told by your health care provider and adjust them as directed. It is normal to feel mild stretching, pulling, tightness, or discomfort as you do these exercises, but you should stop right away if you feel sudden pain or your pain gets worse.Do not begin these exercises until told by your health care provider. STRETCHING AND RANGE OF MOTION EXERCISES These exercises warm up your muscles and joints and improve the movement and flexibility of your hip. These exercises also help to relieve pain, numbness, and tingling. Exercise A: Hamstrings, Supine  1. Lie on your back. 2. Loop a belt or towel over the ball of your left / rightfoot. The ball of your foot is on the walking surface, right under your toes. 3. Straighten your left / rightknee and slowly pull on the belt to raise your leg. ? Do not let your left / right knee bend while you do this. ? Keep your other leg flat on the floor. ? Raise the left / right leg until you feel a gentle stretch behind your left / right knee or thigh. 4. Hold this position for __________ seconds. 5. Slowly return your leg to the starting position. Repeat __________ times. Complete this stretch __________ times a day. Exercise B: Hip Rotators  1. Lie on your back on a firm surface. 2. Hold your left /  right knee with your left / right hand. Hold your ankle with your other hand. 3. Gently pull your left / right knee and rotate your lower leg toward your other shoulder. ? Pull until you feel a stretch in your buttocks. ? Keep your hips and shoulders firmly planted while you do this stretch. 4. Hold this position for __________ seconds. Repeat __________ times. Complete this stretch __________ times a day. Exercise C: V-Sit (Hamstrings and Adductors)  1. Sit on the floor with your legs extended in a large "V" shape. Keep your knees straight during this exercise. 2. Start with your head and chest upright, then bend at your waist to reach for your left foot (position A). You should feel a stretch in your right inner thigh. 3. Hold this position for __________ seconds. Then slowly return to the upright position. 4. Bend at your waist to reach forward (position B). You should feel a stretch behind both of your thighs and knees. 5. Hold this position for __________ seconds. Then slowly return to the upright position. 6. Bend at your waist to reach for your right foot (position C). You should feel a stretch in your left inner thigh. 7. Hold this position for __________ seconds. Then slowly return to the upright position. Repeat __________ times. Complete this stretch __________ times a day. Exercise D: Lunge (Hip Flexors)  1. Place your left / right knee on the floor and bend your other knee so that is directly over your ankle. You should be half-kneeling. 2. Keep good posture with your head over your shoulders. 3. Tighten your buttocks to point your tailbone downward. This helps your back to keep from arching too much. 4. You should feel a gentle stretch in the front of your left / right thigh and hip. If you do not feel any resistance, slightly slide your other foot forward and then slowly lunge forward so your knee once again lines up over your ankle. 5. Make sure your tailbone continues to point  downward. 6. Hold this position for __________ seconds. Repeat __________ times. Complete this stretch __________ times a day. STRENGTHENING EXERCISES These exercises build strength and endurance in your hip. Endurance is the ability to use your muscles for a long time, even after they get tired. Exercise E: Bridge (Hip Extensors)  1. Lie on your back on a firm surface with your knees bent and your feet flat on the floor. 2. Tighten your buttocks muscles and lift your bottom off the floor until the trunk of your body is level with your thighs. ? Do not arch your back. ? You should feel the muscles working in your buttocks and the back of your thighs. If you do not feel these muscles, slide your feet 1-2 inches (2.5-5 cm) farther away from your buttocks. 3. Hold this position for __________ seconds. 4. Slowly lower your hips to the starting position. 5. Let your muscles relax completely between repetitions. 6. If this exercise is too easy, try doing it with your arms crossed over your chest. Repeat __________ times. Complete this exercise __________ times a day. Exercise F: Straight Leg Raises - Hip Abductors  1. Lie on your side with your left / right leg in the top position. Lie so your head, shoulder, knee, and hip line up with each other. You may bend your bottom knee to help you balance. 2. Roll your hips slightly forward, so your hips are stacked directly over each other and your left / right knee is  facing forward. 3. Leading with your heel, lift your top leg 4-6 inches (10-15 cm). You should feel the muscles in your outer hip lifting. ? Do not let your foot drift forward. ? Do not let your knee roll toward the ceiling. 4. Hold this position for __________ seconds. 5. Slowly return to the starting position. 6. Let your muscles relax completely between repetitions. Repeat __________ times. Complete this exercise __________ times a day. Exercise G: Straight Leg Raises - Hip  Adductors  1. Lie on your side with your left / right leg in the bottom position. Lie so your head, shoulder, knee, and hip line up. You may place your upper foot in front to help you balance. 2. Roll your hips slightly forward, so your hips are stacked directly over each other and your left / right knee is facing forward. 3. Tense the muscles in your inner thigh and lift your bottom leg 4-6 inches (10-15 cm). 4. Hold this position for __________ seconds. 5. Slowly return to the starting position. 6. Let your muscles relax completely between repetitions. Repeat __________ times. Complete this exercise __________ times a day. Exercise H: Straight Leg Raises - Quadriceps  1. Lie on your back with your left / right leg extended and your other knee bent. 2. Tense the muscles in the front of your left / right thigh. When you do this, you should see your kneecap slide up or see increased dimpling just above your knee. 3. Tighten these muscles even more and raise your leg 4-6 inches (10-15 cm) off the floor. 4. Hold this position for __________ seconds. 5. Keep these muscles tense as you lower your leg. 6. Relax the muscles slowly and completely between repetitions. Repeat __________ times. Complete this exercise __________ times a day. Exercise I: Hip Abductors, Standing 1. Tie one end of a rubber exercise band or tubing to a secure surface, such as a table or pole. 2. Loop the other end of the band or tubing around your left / right ankle. 3. Keeping your ankle with the band or tubing directly opposite of the secured end, step away until there is tension in the tubing or band. Hold onto a chair as needed for balance. 4. Lift your left / right leg out to your side. While you do this: ? Keep your back upright. ? Keep your shoulders over your hips. ? Keep your toes pointing forward. ? Make sure to use your hip muscles to lift your leg. Do not "throw" your leg or tip your body to lift your  leg. 5. Hold this position for __________ seconds. 6. Slowly return to the starting position. Repeat __________ times. Complete this exercise __________ times a day. Exercise J: Squats (Quadriceps) 1. Stand in a door frame so your feet and knees are in line with the frame. You may place your hands on the frame for balance. 2. Slowly bend your knees and lower your hips like you are going to sit in a chair. ? Keep your lower legs in a straight-up-and-down position. ? Do not let your hips go lower than your knees. ? Do not bend your knees lower than told by your health care provider. ? If your hip pain increases, do not bend as low. 3. Hold this position for ___________ seconds. 4. Slowly push with your legs to return to standing. Do not use your hands to pull yourself to standing. Repeat __________ times. Complete this exercise __________ times a day. This information is not intended  to replace advice given to you by your health care provider. Make sure you discuss any questions you have with your health care provider. Document Released: 04/07/2005 Document Revised: 12/13/2015 Document Reviewed: 03/15/2015 Elsevier Interactive Patient Education  [employer].

## 2017-11-20 ENCOUNTER — Other Ambulatory Visit: Payer: Self-pay | Admitting: Physician Assistant

## 2017-11-20 DIAGNOSIS — Z720 Tobacco use: Secondary | ICD-10-CM

## 2017-11-23 ENCOUNTER — Telehealth: Payer: Self-pay | Admitting: Family Medicine

## 2017-11-23 DIAGNOSIS — F17200 Nicotine dependence, unspecified, uncomplicated: Secondary | ICD-10-CM

## 2017-11-23 MED ORDER — VARENICLINE TARTRATE 1 MG PO TABS: 1.0000 mg | ORAL_TABLET | Freq: Two times a day (BID) | ORAL | 2 refills | Status: DC

## 2017-11-23 NOTE — Telephone Encounter (Signed)
Clarified Chantix prescription order.

## 2017-12-05 ENCOUNTER — Ambulatory Visit (INDEPENDENT_AMBULATORY_CARE_PROVIDER_SITE_OTHER): Payer: BLUE CROSS/BLUE SHIELD

## 2017-12-05 DIAGNOSIS — Z1231 Encounter for screening mammogram for malignant neoplasm of breast: Secondary | ICD-10-CM | POA: Diagnosis not present

## 2017-12-10 ENCOUNTER — Other Ambulatory Visit: Payer: Self-pay | Admitting: Physician Assistant

## 2017-12-10 DIAGNOSIS — J302 Other seasonal allergic rhinitis: Secondary | ICD-10-CM

## 2017-12-10 MED ORDER — SIMVASTATIN 40 MG PO TABS: 40.0000 mg | ORAL_TABLET | Freq: Every day | ORAL | 3 refills | Status: DC

## 2017-12-12 NOTE — Progress Notes (Signed)
Call pt: normal mammogram. Follow up in 1 year.

## 2018-01-03 ENCOUNTER — Encounter: Payer: Self-pay | Admitting: Physician Assistant

## 2018-01-03 ENCOUNTER — Other Ambulatory Visit: Payer: Self-pay | Admitting: Physician Assistant

## 2018-01-03 DIAGNOSIS — J42 Unspecified chronic bronchitis: Secondary | ICD-10-CM

## 2018-01-03 DIAGNOSIS — I10 Essential (primary) hypertension: Secondary | ICD-10-CM

## 2018-01-27 ENCOUNTER — Other Ambulatory Visit: Payer: Self-pay | Admitting: Family Medicine

## 2018-01-27 ENCOUNTER — Other Ambulatory Visit: Payer: Self-pay | Admitting: Physician Assistant

## 2018-01-27 DIAGNOSIS — I10 Essential (primary) hypertension: Secondary | ICD-10-CM

## 2018-01-27 DIAGNOSIS — F17200 Nicotine dependence, unspecified, uncomplicated: Secondary | ICD-10-CM

## 2018-02-04 ENCOUNTER — Encounter: Payer: Self-pay | Admitting: Physician Assistant

## 2018-02-04 ENCOUNTER — Ambulatory Visit (INDEPENDENT_AMBULATORY_CARE_PROVIDER_SITE_OTHER): Payer: BLUE CROSS/BLUE SHIELD | Admitting: Physician Assistant

## 2018-02-04 VITALS — BP 119/73 | HR 84 | Ht 67.75 in | Wt 242.0 lb

## 2018-02-04 DIAGNOSIS — N3281 Overactive bladder: Secondary | ICD-10-CM

## 2018-02-04 DIAGNOSIS — Z23 Encounter for immunization: Secondary | ICD-10-CM | POA: Diagnosis not present

## 2018-02-04 DIAGNOSIS — F17200 Nicotine dependence, unspecified, uncomplicated: Secondary | ICD-10-CM

## 2018-02-04 DIAGNOSIS — Z6835 Body mass index (BMI) 35.0-35.9, adult: Secondary | ICD-10-CM

## 2018-02-04 DIAGNOSIS — R7301 Impaired fasting glucose: Secondary | ICD-10-CM | POA: Insufficient documentation

## 2018-02-04 DIAGNOSIS — E6609 Other obesity due to excess calories: Secondary | ICD-10-CM | POA: Diagnosis not present

## 2018-02-04 DIAGNOSIS — I10 Essential (primary) hypertension: Secondary | ICD-10-CM

## 2018-02-04 DIAGNOSIS — J42 Unspecified chronic bronchitis: Secondary | ICD-10-CM

## 2018-02-04 DIAGNOSIS — R7303 Prediabetes: Secondary | ICD-10-CM

## 2018-02-04 LAB — POCT GLYCOSYLATED HEMOGLOBIN (HGB A1C): Hemoglobin A1C: 5.9 % — AB (ref 4.0–5.6)

## 2018-02-04 MED ORDER — MIRABEGRON ER 50 MG PO TB24: 50.0000 mg | ORAL_TABLET | Freq: Every day | ORAL | 5 refills | Status: DC

## 2018-02-04 MED ORDER — VARENICLINE TARTRATE 1 MG PO TABS: 1.0000 mg | ORAL_TABLET | Freq: Two times a day (BID) | ORAL | 2 refills | Status: DC

## 2018-02-04 NOTE — Patient Instructions (Signed)

## 2018-02-04 NOTE — Progress Notes (Signed)
Subjective:    Patient ID: Ashley Scott, female    DOB: 04/06/1955, 62 y.o.   MRN: [id number]  HPI  Pt is a 62 yo female with OAB, COPD, HTN, pre diabetes who presents to the clinic for 3 month follow up.   OAB- never got myrberiq filled because of cost. She did not have coupon card though. She has tried medications in the past but does not know the names. She did not like SE"s.   COPD- continues on Trelegy. She is actively trying to quit smoking. She is down from a pack a day to 1 pack a week. She is on chantix and it helps but does not feel like she can fully quit yet.   Last visit showed elevated fasting glucose. Needs a1c.   HTN- no CP, palpitations, headaches or vision changes.   .. Active Ambulatory Problems    Diagnosis Date Noted  . COPD (chronic obstructive pulmonary disease) ([place]) 10/03/2017  . Hypercholesteremia 10/03/2017  . Adenomatous polyp 10/03/2017  . Family history of heart failure 10/04/2017  . SOB (shortness of breath) on exertion 10/04/2017  . Current smoker 10/04/2017  . Bilateral impacted cerumen 10/04/2017  . Vitamin D insufficiency 10/04/2017  . Essential hypertension 10/04/2017  . Depression, recurrent ([place]) 10/04/2017  . Seasonal allergies 10/04/2017  . Obesity (BMI 30-39.9) 10/04/2017  . Bilateral lower extremity edema 10/08/2017  . Orthopnea 10/08/2017  . Left leg pain 10/08/2017  . Tricuspid regurgitation 10/16/2017  . Class 2 obesity due to excess calories without serious comorbidity with body mass index (BMI) of 35.0 to 35.9 in adult 11/02/2017  . OAB (overactive bladder) 11/02/2017  . Elevated fasting glucose 02/04/2018   Resolved Ambulatory Problems    Diagnosis Date Noted  . No Resolved Ambulatory Problems   Past Medical History:  Diagnosis Date  . Anxiety   . Asthma   . Herpes   . Hypertension   . Seizure-like activity ([place])      Review of Systems  All other systems reviewed and are negative.      Objective:   Physical  Exam  Constitutional: She is oriented to person, place, and time. She appears well-developed and well-nourished.  Obese.   HENT:  Head: Normocephalic and atraumatic.  Cardiovascular: Normal rate and regular rhythm.  Murmur heard. Pulmonary/Chest: Effort normal and breath sounds normal. She has no wheezes.  Neurological: She is alert and oriented to person, place, and time.  Skin: No rash noted.  Psychiatric: She has a normal mood and affect. Her behavior is normal.          Assessment & Plan:   Marland KitchenMarland KitchenDiagnoses and all orders for this visit:  Essential hypertension  OAB (overactive bladder) -     mirabegron ER (MYRBETRIQ) 50 MG TB24 tablet; Take 1 tablet (50 mg total) by mouth daily.  Chronic bronchitis, unspecified chronic bronchitis type ([place])  Class 2 obesity due to excess calories without serious comorbidity with body mass index (BMI) of 35.0 to 35.9 in adult  Current smoker -     varenicline (CHANTIX) 1 MG tablet; Take 1 tablet (1 mg total) by mouth 2 (two) times daily.  Pre-diabetes -     POCT glycosylated hemoglobin (Hb A1C)  Need for immunization against influenza -     Flu Vaccine QUAD 36+ mos IM     .Marland Kitchen Results for orders placed or performed in visit on 02/04/18  POCT glycosylated hemoglobin (Hb A1C)  Result Value Ref Range   Hemoglobin A1C  5.9 (A) 4.0 - 5.6 %   HbA1c POC (<> result, manual entry)     HbA1c, POC (prediabetic range)     HbA1c, POC (controlled diabetic range)      discusssed pre diabetes and diet changes. Recheck in 6 months. Declines metformin.   Continue with Trelegy. Needs to completely stop smoking. Discussed cravings. In the next 3 weeks she needs to stop. Insurance will only pay for 6 months of chantix at a time. Given another 3 months.   Printed myrbetriq with coupon. Try again for better price. Let me know if cannot get.   BP looks great. Continue on medication. Follow up in 6 months.   Marland Kitchen.Spent 30 minutes with patient and greater  than 50 percent of visit spent counseling patient regarding treatment plan.

## 2018-02-05 ENCOUNTER — Encounter: Payer: Self-pay | Admitting: Physician Assistant

## 2018-02-05 MED ORDER — LISINOPRIL-HYDROCHLOROTHIAZIDE 20-12.5 MG PO TABS: 1.0000 | ORAL_TABLET | Freq: Every day | ORAL | 1 refills | Status: DC

## 2018-03-11 ENCOUNTER — Other Ambulatory Visit: Payer: Self-pay

## 2018-03-11 ENCOUNTER — Emergency Department (INDEPENDENT_AMBULATORY_CARE_PROVIDER_SITE_OTHER): Payer: BLUE CROSS/BLUE SHIELD

## 2018-03-11 ENCOUNTER — Emergency Department (INDEPENDENT_AMBULATORY_CARE_PROVIDER_SITE_OTHER)
Admission: EM | Admit: 2018-03-11 | Discharge: 2018-03-11 | Disposition: A | Discharge status: Home / Self Care | Payer: BLUE CROSS/BLUE SHIELD | Source: Home / Self Care

## 2018-03-11 DIAGNOSIS — R0781 Pleurodynia: Secondary | ICD-10-CM

## 2018-03-11 DIAGNOSIS — S52572A Other intraarticular fracture of lower end of left radius, initial encounter for closed fracture: Secondary | ICD-10-CM

## 2018-03-11 DIAGNOSIS — S80811A Abrasion, right lower leg, initial encounter: Secondary | ICD-10-CM

## 2018-03-11 DIAGNOSIS — S80812A Abrasion, left lower leg, initial encounter: Secondary | ICD-10-CM

## 2018-03-11 DIAGNOSIS — X58XXXA Exposure to other specified factors, initial encounter: Secondary | ICD-10-CM | POA: Diagnosis not present

## 2018-03-11 DIAGNOSIS — W010XXA Fall on same level from slipping, tripping and stumbling without subsequent striking against object, initial encounter: Secondary | ICD-10-CM | POA: Diagnosis not present

## 2018-03-11 DIAGNOSIS — S299XXA Unspecified injury of thorax, initial encounter: Secondary | ICD-10-CM | POA: Diagnosis not present

## 2018-03-11 MED ORDER — TRAMADOL HCL 50 MG PO TABS: 50.0000 mg | ORAL_TABLET | Freq: Three times a day (TID) | ORAL | 0 refills | Status: DC | PRN

## 2018-03-11 NOTE — ED Provider Notes (Signed)
[facility]    CSN: [id number] Arrival date & time: 03/11/18  1533     History   Chief Complaint Chief Complaint  Patient presents with  . Fall    HPI Ashley Scott is a 62 y.o. female.   HPI  Ashley Scott is a 62 y.o. female presenting to UC with c/o Left wrist and hand pain, Right rib pain, and scrapes to her lower legs after being pulled down by her bulldog who was chasing after a cat.  Pt landed on her cement porch. Denies hitting her head but states "everything went black, but I think it was because it knocked the breath out of me."  She denies HA or dizziness now. Denies neck or back pain.  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  Wrist pain is most bothersome, aching and sore, limited ROM due to pain.  Moderate to severe in severity. Last tetanus: Sept 2019.    Past Medical History:  Diagnosis Date  . Anxiety   . Asthma   . COPD (chronic obstructive pulmonary disease) ([place])   . Herpes   . Hypercholesteremia   . Hypertension   . Seizure-like activity [facility])     Patient Active Problem List   Diagnosis Date Noted  . Elevated fasting glucose 02/04/2018  . Class 2 obesity due to excess calories without serious comorbidity with body mass index (BMI) of 35.0 to 35.9 in adult 11/02/2017  . OAB (overactive bladder) 11/02/2017  . Tricuspid regurgitation 10/16/2017  . Bilateral lower extremity edema 10/08/2017  . Orthopnea 10/08/2017  . Left leg pain 10/08/2017  . Family history of heart failure 10/04/2017  . SOB (shortness of breath) on exertion 10/04/2017  . Current smoker 10/04/2017  . Bilateral impacted cerumen 10/04/2017  . Vitamin D insufficiency 10/04/2017  . Essential hypertension 10/04/2017  . Depression, recurrent ([place]) 10/04/2017  . Seasonal allergies 10/04/2017  . Obesity (BMI 30-39.9) 10/04/2017  . COPD (chronic obstructive pulmonary disease) ([place]) 10/03/2017  . Hypercholesteremia 10/03/2017   . Adenomatous polyp 10/03/2017    Past Surgical History:  Procedure Laterality Date  . PARTIAL HYSTERECTOMY    . right wrist surgery    . TONSILLECTOMY AND ADENOIDECTOMY    . WISDOM TOOTH EXTRACTION      OB History   None      Home Medications    Prior to Admission medications   Medication Sig Start Date End Date Taking? Authorizing Provider  cetirizine HCl (ZYRTEC) 1 MG/ML solution Take by mouth daily.    [provider]  CHANTIX 1 MG tablet TAKE 1 TABLET BY MOUTH TWICE A DAY 01/28/18   Gregor Hams, MD  cholecalciferol (VITAMIN D) 1000 units tablet Take by mouth.    [provider]  fexofenadine (ALLEGRA) 180 MG tablet Take 180 mg by mouth daily.    [provider]  lisinopril-hydrochlorothiazide (PRINZIDE,ZESTORETIC) 20-12.5 MG tablet Take 1 tablet by mouth daily. 02/05/18   Breeback, Jade L, PA-C  mirabegron ER (MYRBETRIQ) 50 MG TB24 tablet Take 1 tablet (50 mg total) by mouth daily. 02/04/18   Breeback, Jade L, PA-C  sertraline (ZOLOFT) 100 MG tablet Take 100 mg by mouth daily. 07/21/17   [provider]  simvastatin (ZOCOR) 40 MG tablet Take 1 tablet (40 mg total) by mouth at bedtime. 12/10/17   Breeback, Royetta Car, PA-C  traMADol (ULTRAM) 50 MG tablet Take 1 tablet (50 mg total) by mouth every  8 (eight) hours as needed. 03/11/18   Jaquilla Woodroof O, PA-C  TRELEGY ELLIPTA 100-62.5-25 MCG/INH AEPB TAKE 1 PUFF BY MOUTH EVERY DAY 01/04/18   Breeback, Jade L, PA-C  varenicline (CHANTIX) 1 MG tablet Take 1 tablet (1 mg total) by mouth 2 (two) times daily. 02/04/18   Breeback, Jade L, PA-C  VENTOLIN HFA 108 (90 Base) MCG/ACT inhaler TAKE 2 PUFFS BY MOUTH EVERY 6 HOURS AS NEEDED FOR WHEEZE 08/01/17   [provider]    Family History Family History  Problem Relation Age of Onset  . Stomach cancer Mother   . Brain cancer Mother   . Hypertension Mother   . Hypercholesterolemia Mother   . Hypercholesterolemia Father   . Diabetes Father   .  Hypertension Father   . Liver disease Sister   . Heart attack Maternal Grandfather   . Brain cancer Maternal Grandfather   . Breast cancer Maternal Aunt     Social History Social History   Tobacco Use  . Smoking status: Current Every Day Smoker    Packs/day: 1.00    Types: Cigarettes  . Smokeless tobacco: Never Used  Substance Use Topics  . Alcohol use: Yes    Comment: very rarely  . Drug use: Never     Allergies   Oxycodone-acetaminophen and Bupropion   Review of Systems Review of Systems  Respiratory: Negative for chest tightness and shortness of breath.   Cardiovascular: Positive for chest pain (Right ribs). Negative for palpitations.  Musculoskeletal: Positive for arthralgias, joint swelling and myalgias. Negative for back pain, neck pain and neck stiffness.  Skin: Positive for wound. Negative for color change.  Neurological: Positive for weakness ( left wrist). Negative for numbness.     Physical Exam Triage Vital Signs ED Triage Vitals [03/11/18 1603]  Enc Vitals Group     BP 124/84     Pulse Rate 86     Resp 18     Temp 97.9 F (36.6 C)     Temp Source Oral     SpO2 96 %     Weight 245 lb (111.1 kg)     Height 5\' 7"  (1.702 m)     Head Circumference      Peak Flow      Pain Score 6     Pain Loc      Pain Edu?      Excl. in [place]?    No data found.  Updated Vital Signs BP 124/84 (BP Location: Right Arm)   Pulse 86   Temp 97.9 F (36.6 C) (Oral)   Resp 18   Ht 5\' 7"  (1.702 m)   Wt 245 lb (111.1 kg)   SpO2 96%   BMI 38.37 kg/m   Visual Acuity Right Eye Distance:   Left Eye Distance:   Bilateral Distance:    Right Eye Near:   Left Eye Near:    Bilateral Near:     Physical Exam  Constitutional: She is oriented to person, place, and time. She appears well-developed and well-nourished.  HENT:  Head: Normocephalic and atraumatic.  Eyes: EOM are normal.  Neck: Normal range of motion. Neck supple.  No midline bone tenderness, no crepitus  or step-offs.   Cardiovascular: Normal rate.  Pulmonary/Chest: Effort normal. No respiratory distress. She exhibits tenderness.  Mild tenderness to lower Right anterior ribs. No crepitus or deformity. Skin in tact. No ecchymosis.   Musculoskeletal: She exhibits edema and tenderness.  Left wrist: mild edema, diffuse tenderness, limited  flexion and extension. No tenderness to hand or fingers. Full ROM hand. 4/5 grip strength, pain radiates into wrist. Left elbow: non-tender, full ROM No spinal tenderness.   Neurological: She is alert and oriented to person, place, and time.  Skin: Skin is warm and dry.  Right lower leg: large superficial abrasion, bleeding controlled. Left lower leg: 1cm superficial abrasion, oozing red blood.   Psychiatric: She has a normal mood and affect. Her behavior is normal.  Nursing note and vitals reviewed.    UC Treatments / Results  Labs (all labs ordered are listed, but only abnormal results are displayed) Labs Reviewed - No data to display  EKG None  Radiology Dg Ribs Unilateral W/chest Right  Result Date: 03/11/2018 CLINICAL DATA:  Fall with right anterior rib pain EXAM: RIGHT RIBS AND CHEST - 3+ VIEW COMPARISON:  None. FINDINGS: Single-view chest demonstrates linear atelectasis or scar at the left base. No pleural effusion. Normal heart size. No pneumothorax. Right rib series demonstrates no acute displaced right rib fracture. IMPRESSION: Negative. Electronically Signed   By: Donavan Foil M.D.   On: 03/11/2018 16:49   Dg Wrist Complete Left  Result Date: 03/11/2018 CLINICAL DATA:  62 year old female status post fall, pulled by dog today. Pain. EXAM: LEFT WRIST - COMPLETE 3+ VIEW COMPARISON:  None. FINDINGS: Soft tissue swelling with subtle nondisplaced longitudinal fracture traversing the distal left radius from the radiocarpal joint to the metadiaphysis (image 2). No comminution or DRU involvement suspected. Distal ulna appears intact. Carpal bone  alignment and joint spaces within normal limits. Metacarpals appear intact. IMPRESSION: Nondisplaced longitudinal intra-articular fracture traversing the distal left radius from the radiocarpal joint to the metadiaphysis. Electronically Signed   By: Genevie Ann M.D.   On: 03/11/2018 16:49    Procedures Splint Application Date/Time: 16/01/9603 8:38 AM Performed by: Noe Gens, PA-C Authorized by: Noe Gens, PA-C   Consent:    Consent obtained:  Verbal   Consent given by:  Patient and parent   Risks discussed:  Discoloration, numbness, pain and swelling   Alternatives discussed:  Delayed treatment Pre-procedure details:    Sensation:  Normal Procedure details:    Laterality:  Left   Location:  Wrist   Wrist:  L wrist   Strapping: no     Cast type:  Long arm   Splint type:  Sugar tong   Supplies:  Cotton padding, Ortho-Glass and elastic bandage Post-procedure details:    Pain:  Unchanged   Sensation:  Normal   Patient tolerance of procedure:  Tolerated well, no immediate complications   (including critical care time)  Medications Ordered in UC Medications - No data to display  Initial Impression / Assessment and Plan / UC Course  I have reviewed the triage vital signs and the nursing notes.  Pertinent labs & imaging results that were available during my care of the patient were reviewed by me and considered in my medical decision making (see chart for details).     Discussed imaging with pt, Splint applied as noted above, sling also provided for comfort Wounds cleaned by nursing staff Encouraged f/u with PCP and Sports Medicine  Final Clinical Impressions(s) / UC Diagnoses   Final diagnoses:  Fall from slip, trip, or stumble, initial encounter  Other closed intra-articular fracture of distal end of left radius, initial encounter  Abrasion, right lower leg, initial encounter  Abrasion, left lower leg, initial encounter  Rib pain on right side     Discharge  Instructions      You may take 500mg  acetaminophen every 4-6 hours or in combination with ibuprofen 400-600mg  every 6-8 hours as needed for pain and inflammation.  You may also take the tramadol as prescribed for severe pain. Tramadol is strong pain medication. While taking, do not drink alcohol, drive, or perform any other activities that requires focus while taking these medications.   Please call tomorrow to schedule a follow up appointment later this week or early next week with Dr. Dianah Field (Dr. Darene Lamer) Sports Medicine, for further evaluation and continued management of your broken wrist.       ED Prescriptions    Medication Sig Dispense Auth. Provider   traMADol (ULTRAM) 50 MG tablet Take 1 tablet (50 mg total) by mouth every 8 (eight) hours as needed. 8 tablet Noe Gens, PA-C     Controlled Substance Prescriptions [place] Controlled Substance Registry consulted? Yes, I have consulted the [place] Controlled Substances Registry for this patient, and feel the risk/benefit ratio today is favorable for proceeding with this prescription for a controlled substance.   Noe Gens, [place] 03/12/18 [phone]

## 2018-03-11 NOTE — ED Triage Notes (Signed)
Pt was walking dog this morning and pulled her down on concrete.  He left hand, wrist, and forearm.  Right and left rib pain.  Large 4"x4" scrape on right leg, and and cut on left leg.  She denies head trauma, but states that "everything went black, but I think it was because it knocked the breath out of me."

## 2018-03-11 NOTE — Discharge Instructions (Signed)
°  You may take 500mg  acetaminophen every 4-6 hours or in combination with ibuprofen 400-600mg  every 6-8 hours as needed for pain and inflammation.  You may also take the tramadol as prescribed for severe pain. Tramadol is strong pain medication. While taking, do not drink alcohol, drive, or perform any other activities that requires focus while taking these medications.   Please call tomorrow to schedule a follow up appointment later this week or early next week with Dr. Dianah Field (Dr. Darene Lamer) Sports Medicine, for further evaluation and continued management of your broken wrist.

## 2018-03-12 DIAGNOSIS — S52572A Other intraarticular fracture of lower end of left radius, initial encounter for closed fracture: Secondary | ICD-10-CM | POA: Diagnosis not present

## 2018-03-15 ENCOUNTER — Encounter: Payer: Self-pay | Admitting: Sports Medicine

## 2018-03-15 ENCOUNTER — Ambulatory Visit (INDEPENDENT_AMBULATORY_CARE_PROVIDER_SITE_OTHER): Payer: BLUE CROSS/BLUE SHIELD | Admitting: Sports Medicine

## 2018-03-15 DIAGNOSIS — S52572A Other intraarticular fracture of lower end of left radius, initial encounter for closed fracture: Secondary | ICD-10-CM | POA: Diagnosis not present

## 2018-03-15 DIAGNOSIS — S5292XA Unspecified fracture of left forearm, initial encounter for closed fracture: Secondary | ICD-10-CM | POA: Insufficient documentation

## 2018-03-15 MED ORDER — HYDROCODONE-ACETAMINOPHEN 5-325 MG PO TABS: 1.0000 | ORAL_TABLET | Freq: Three times a day (TID) | ORAL | 0 refills | Status: DC | PRN

## 2018-03-15 NOTE — Assessment & Plan Note (Signed)
Nondisplaced intra-articular distal radius fracture. Discontinue sugar tong splint. Switching to EXOS cast. Return in 3 weeks, x-ray before visit.  I billed a fracture code for this encounter, all subsequent visits will be post-op checks in the global period.

## 2018-03-15 NOTE — Progress Notes (Signed)
Subjective:    CC: Fracture  HPI: This is a pleasant 62 year old female, she recently had a fall, was seen in urgent care, x-rays showed a distal radius fracture, she was appropriately placed in a sugar tong splint and referred to me for further evaluation and definitive treatment, pain is moderate, persistent, localized without radiation.  I reviewed the past medical history, family history, social history, surgical history, and allergies today and no changes were needed.  Please see the problem list section below in epic for further details.  Past Medical History: Past Medical History:  Diagnosis Date  . Anxiety   . Asthma   . COPD (chronic obstructive pulmonary disease) ([place])   . Herpes   . Hypercholesteremia   . Hypertension   . Seizure-like activity [facility])    Past Surgical History: Past Surgical History:  Procedure Laterality Date  . PARTIAL HYSTERECTOMY    . right wrist surgery    . TONSILLECTOMY AND ADENOIDECTOMY    . WISDOM TOOTH EXTRACTION     Social History: Social History   Socioeconomic History  . Marital status: Divorced    Spouse name: Not on file  . Number of children: Not on file  . Years of education: Not on file  . Highest education level: Not on file  Occupational History  . Not on file  Social Needs  . Financial resource strain: Not on file  . Food insecurity:    Worry: Not on file    Inability: Not on file  . Transportation needs:    Medical: Not on file    Non-medical: Not on file  Tobacco Use  . Smoking status: Current Every Day Smoker    Packs/day: 1.00    Types: Cigarettes  . Smokeless tobacco: Never Used  Substance and Sexual Activity  . Alcohol use: Yes    Comment: very rarely  . Drug use: Never  . Sexual activity: Not Currently  Lifestyle  . Physical activity:    Days per week: Not on file    Minutes per session: Not on file  . Stress: Not on file  Relationships  . Social connections:    Talks on phone: Not on file    Gets  together: Not on file    Attends religious service: Not on file    Active member of club or organization: Not on file    Attends meetings of clubs or organizations: Not on file    Relationship status: Not on file  Other Topics Concern  . Not on file  Social History Narrative  . Not on file   Family History: Family History  Problem Relation Age of Onset  . Stomach cancer Mother   . Brain cancer Mother   . Hypertension Mother   . Hypercholesterolemia Mother   . Hypercholesterolemia Father   . Diabetes Father   . Hypertension Father   . Liver disease Sister   . Heart attack Maternal Grandfather   . Brain cancer Maternal Grandfather   . Breast cancer Maternal Aunt    Allergies: Allergies  Allergen Reactions  . Oxycodone-Acetaminophen Other (See Comments)    Shaking uncontrolable  . Bupropion     Seizures   Medications: See med rec.  Review of Systems: No fevers, chills, night sweats, weight loss, chest pain, or shortness of breath.   Objective:    General: Well Developed, well nourished, and in no acute distress.  Neuro: Alert and oriented x3, extra-ocular muscles intact, sensation grossly intact.  HEENT: Normocephalic, atraumatic,  pupils equal round reactive to light, neck supple, no masses, no lymphadenopathy, thyroid nonpalpable.  Skin: Warm and dry, no rashes. Cardiac: Regular rate and rhythm, no murmurs rubs or gallops, no lower extremity edema.  Respiratory: Clear to auscultation bilaterally. Not using accessory muscles, speaking in full sentences. Left wrist: Bruised, minimally swollen, tender to palpation over the dorsal distal radius.  X-rays personally reviewed, nondisplaced intra-articular fracture through the distal radius.  EXOS cast applied.  Impression and Recommendations:    Fracture of radius, left, closed Nondisplaced intra-articular distal radius fracture. Discontinue sugar tong splint. Switching to EXOS cast. Return in 3 weeks, x-ray before  visit.  I billed a fracture code for this encounter, all subsequent visits will be post-op checks in the global period. ___________________________________________ Gwen Her. Dianah Field, M.D., ABFM., CAQSM. Primary Care and Sports Medicine [facility]  Adjunct Professor of [employer]

## 2018-04-05 ENCOUNTER — Ambulatory Visit (INDEPENDENT_AMBULATORY_CARE_PROVIDER_SITE_OTHER): Payer: BLUE CROSS/BLUE SHIELD | Admitting: Sports Medicine

## 2018-04-05 ENCOUNTER — Encounter: Payer: Self-pay | Admitting: Sports Medicine

## 2018-04-05 ENCOUNTER — Ambulatory Visit (INDEPENDENT_AMBULATORY_CARE_PROVIDER_SITE_OTHER): Payer: BLUE CROSS/BLUE SHIELD

## 2018-04-05 DIAGNOSIS — S52572A Other intraarticular fracture of lower end of left radius, initial encounter for closed fracture: Secondary | ICD-10-CM

## 2018-04-05 DIAGNOSIS — S52502D Unspecified fracture of the lower end of left radius, subsequent encounter for closed fracture with routine healing: Secondary | ICD-10-CM | POA: Diagnosis not present

## 2018-04-05 DIAGNOSIS — R600 Localized edema: Secondary | ICD-10-CM | POA: Diagnosis not present

## 2018-04-05 DIAGNOSIS — X58XXXD Exposure to other specified factors, subsequent encounter: Secondary | ICD-10-CM

## 2018-04-05 DIAGNOSIS — S52572D Other intraarticular fracture of lower end of left radius, subsequent encounter for closed fracture with routine healing: Secondary | ICD-10-CM

## 2018-04-05 MED ORDER — DOXYCYCLINE HYCLATE 100 MG PO TABS: 100.0000 mg | ORAL_TABLET | Freq: Two times a day (BID) | ORAL | 0 refills | Status: AC

## 2018-04-05 MED ORDER — AMBULATORY NON FORMULARY MEDICATION: 0 refills | Status: DC

## 2018-04-05 NOTE — Assessment & Plan Note (Signed)
Is 5 weeks post fracture, she is completely pain-free at the fracture site and has full range of motion and strength, discontinue cast. Return as needed for this.

## 2018-04-05 NOTE — Assessment & Plan Note (Signed)
Bilateral edema with a severe abrasion over the anterior right shin. I do not think this is going to need a skin graft but it is slightly erythematous surrounding so we will add doxycycline, adding bilateral lower extremity compression hose. She does understand that she will have a scar here.

## 2018-04-05 NOTE — Progress Notes (Signed)
Subjective:    CC: Follow-up  HPI: Left distal radius fracture: 5 weeks post fracture, pain-free now.  Abrasion shin right: Now with some redness, pain.  I reviewed the past medical history, family history, social history, surgical history, and allergies today and no changes were needed.  Please see the problem list section below in epic for further details.  Past Medical History: Past Medical History:  Diagnosis Date  . Anxiety   . Asthma   . COPD (chronic obstructive pulmonary disease) ([place])   . Herpes   . Hypercholesteremia   . Hypertension   . Seizure-like activity [facility])    Past Surgical History: Past Surgical History:  Procedure Laterality Date  . PARTIAL HYSTERECTOMY    . right wrist surgery    . TONSILLECTOMY AND ADENOIDECTOMY    . WISDOM TOOTH EXTRACTION     Social History: Social History   Socioeconomic History  . Marital status: Divorced    Spouse name: Not on file  . Number of children: Not on file  . Years of education: Not on file  . Highest education level: Not on file  Occupational History  . Not on file  Social Needs  . Financial resource strain: Not on file  . Food insecurity:    Worry: Not on file    Inability: Not on file  . Transportation needs:    Medical: Not on file    Non-medical: Not on file  Tobacco Use  . Smoking status: Current Every Day Smoker    Packs/day: 1.00    Types: Cigarettes  . Smokeless tobacco: Never Used  Substance and Sexual Activity  . Alcohol use: Yes    Comment: very rarely  . Drug use: Never  . Sexual activity: Not Currently  Lifestyle  . Physical activity:    Days per week: Not on file    Minutes per session: Not on file  . Stress: Not on file  Relationships  . Social connections:    Talks on phone: Not on file    Gets together: Not on file    Attends religious service: Not on file    Active member of club or organization: Not on file    Attends meetings of clubs or organizations: Not on file   Relationship status: Not on file  Other Topics Concern  . Not on file  Social History Narrative  . Not on file   Family History: Family History  Problem Relation Age of Onset  . Stomach cancer Mother   . Brain cancer Mother   . Hypertension Mother   . Hypercholesterolemia Mother   . Hypercholesterolemia Father   . Diabetes Father   . Hypertension Father   . Liver disease Sister   . Heart attack Maternal Grandfather   . Brain cancer Maternal Grandfather   . Breast cancer Maternal Aunt    Allergies: Allergies  Allergen Reactions  . Oxycodone-Acetaminophen Other (See Comments)    Shaking uncontrolable  . Bupropion     Seizures   Medications: See med rec.  Review of Systems: No fevers, chills, night sweats, weight loss, chest pain, or shortness of breath.   Objective:    General: Well Developed, well nourished, and in no acute distress.  Neuro: Alert and oriented x3, extra-ocular muscles intact, sensation grossly intact.  HEENT: Normocephalic, atraumatic, pupils equal round reactive to light, neck supple, no masses, no lymphadenopathy, thyroid nonpalpable.  Skin: Warm and dry, no rashes. Cardiac: Regular rate and rhythm, no murmurs rubs or gallops,  no lower extremity edema.  Respiratory: Clear to auscultation bilaterally. Not using accessory muscles, speaking in full sentences. Right shin: There is a very large area of abrasion, there is a black eschar overlying it as well as surrounding erythema with swelling, and tenderness.  No drainage. Left wrist: Inspection normal with no visible erythema or swelling. ROM smooth and normal with good flexion and extension and ulnar/radial deviation that is symmetrical with opposite wrist. Palpation is normal over metacarpals, navicular, lunate, and TFCC; tendons without tenderness/ swelling No snuffbox tenderness. No tenderness over Canal of [place]. Strength 5/5 in all directions without pain. Negative tinel's and phalens  signs. Negative Finkelstein sign. Negative Ismael's test.  X-rays reviewed, distal radius fracture is stable, nonangulated, nondisplaced.  Impression and Recommendations:    Fracture of radius, left, closed Is 5 weeks post fracture, she is completely pain-free at the fracture site and has full range of motion and strength, discontinue cast. Return as needed for this.  Bilateral lower extremity edema Bilateral edema with a severe abrasion over the anterior right shin. I do not think this is going to need a skin graft but it is slightly erythematous surrounding so we will add doxycycline, adding bilateral lower extremity compression hose. She does understand that she will have a scar here. ___________________________________________ Gwen Her. Dianah Field, M.D., ABFM., CAQSM. Primary Care and Sports Medicine [facility]  Adjunct Professor of [place] [employer] [facility] [employer]

## 2018-04-07 ENCOUNTER — Other Ambulatory Visit: Payer: Self-pay | Admitting: Physician Assistant

## 2018-04-07 DIAGNOSIS — J42 Unspecified chronic bronchitis: Secondary | ICD-10-CM

## 2018-04-19 ENCOUNTER — Encounter: Payer: Self-pay | Admitting: Sports Medicine

## 2018-04-19 ENCOUNTER — Ambulatory Visit (INDEPENDENT_AMBULATORY_CARE_PROVIDER_SITE_OTHER): Payer: BLUE CROSS/BLUE SHIELD | Admitting: Sports Medicine

## 2018-04-19 DIAGNOSIS — S81801D Unspecified open wound, right lower leg, subsequent encounter: Secondary | ICD-10-CM | POA: Diagnosis not present

## 2018-04-19 DIAGNOSIS — S81801A Unspecified open wound, right lower leg, initial encounter: Secondary | ICD-10-CM | POA: Insufficient documentation

## 2018-04-19 MED ORDER — HYDROCODONE-ACETAMINOPHEN 5-325 MG PO TABS: 1.0000 | ORAL_TABLET | Freq: Three times a day (TID) | ORAL | 0 refills | Status: DC | PRN

## 2018-04-19 NOTE — Assessment & Plan Note (Addendum)
Large full-thickness wound down to the tibialis anterior fascia/cribriform fascia. She will need a Psychiatric nurse, wound care, and possibly a wound VAC for this. I debrided the wound, I did a wet-to-dry dressing which she will do at home every day. Return to see me as needed for this.

## 2018-04-19 NOTE — Patient Instructions (Signed)
Mechanical Wound Debridement Mechanical wound debridement is a treatment to remove dead tissue from a wound. This helps the wound heal. The treatment involves cleaning the wound (irrigation) and using a pad or gauze (dressing) to remove dead tissue and debris from the wound. There are different types of mechanical wound debridement. Depending on the wound, you may need to repeat this procedure or change to another form of debridement as your wound starts to heal. Tell a health care provider about:  Any allergies you have.  All medicines you are taking, including vitamins, herbs, eye drops, creams, and over-the-counter medicines.  Any blood disorders you have.  Any medical conditions you have, including any conditions that: ? Cause a significant decrease in blood circulation to the part of the body where the wound is, such as peripheral vascular disease. ? Compromise your defense (immune) system or white blood count.  Any surgeries you have had.  Whether you are pregnant or may be pregnant. What are the risks? Generally, this is a safe procedure. However, problems may occur, including:  Infection.  Bleeding.  Damage to healthy tissue in and around your wound.  Soreness or pain.  Failure of the wound to heal.  Scarring. What happens before the procedure? You may be given antibiotic medicine to help prevent infection. What happens during the procedure?  Your health care provider may apply a numbing medicine (topical anesthetic) to the wound.  Your health care provider will irrigate your wound with a germ-free (sterile), salt-water (saline) solution. This removes debris, bacteria, and dead tissue.  Depending on what type of mechanical wound debridement you are having, your health care provider may do one of the following: ? Put a dressing on your wound. You may have dry gauze pad placed into the wound. Your health care provider will remove the gauze after the wound is dry. Any  dead tissue and debris that has dried into the gauze will be lifted out of the wound (wet-to-dry debridement). ? Use a type of pad (monofilament fiber debridement pad). This pad has a fluffy surface on one side that picks up dead tissue and debris from your wound. Your health care provider wets the pad and wipes it over your wound for several minutes. ? Irrigate your wound with a pressurized stream of solution such as saline or water.  Once your health care provider is finished, he or she may apply a light dressing to your wound. The procedure may vary among health care providers and hospitals. What happens after the procedure?  You may receive medicine for pain.  You will continue to receive antibiotic medicine if it was started before your procedure. This information is not intended to replace advice given to you by your health care provider. Make sure you discuss any questions you have with your health care provider. Document Released: 12/09/2014 Document Revised: 08/26/2015 Document Reviewed: 07/29/2014 Elsevier Interactive Patient Education  2019 [employer].

## 2018-04-19 NOTE — Progress Notes (Signed)
Subjective:    CC: Recheck wound  HPI: Ashley Scott is a pleasant 63 year old female, about a month ago she had a fall, she impacted the anterior aspect of her right shin.  She was referred to me for a distal radius fracture which is now healed and pain-free.  At the last visit about a week ago I noted some erythema around an eschar over her anterior shin.  We started a course of doxycycline, she returns today not much better.  I reviewed the past medical history, family history, social history, surgical history, and allergies today and no changes were needed.  Please see the problem list section below in epic for further details.  Past Medical History: Past Medical History:  Diagnosis Date  . Anxiety   . Asthma   . COPD (chronic obstructive pulmonary disease) ([place])   . Herpes   . Hypercholesteremia   . Hypertension   . Seizure-like activity [facility])    Past Surgical History: Past Surgical History:  Procedure Laterality Date  . PARTIAL HYSTERECTOMY    . right wrist surgery    . TONSILLECTOMY AND ADENOIDECTOMY    . WISDOM TOOTH EXTRACTION     Social History: Social History   Socioeconomic History  . Marital status: Divorced    Spouse name: Not on file  . Number of children: Not on file  . Years of education: Not on file  . Highest education level: Not on file  Occupational History  . Not on file  Social Needs  . Financial resource strain: Not on file  . Food insecurity:    Worry: Not on file    Inability: Not on file  . Transportation needs:    Medical: Not on file    Non-medical: Not on file  Tobacco Use  . Smoking status: Current Every Day Smoker    Packs/day: 1.00    Types: Cigarettes  . Smokeless tobacco: Never Used  Substance and Sexual Activity  . Alcohol use: Yes    Comment: very rarely  . Drug use: Never  . Sexual activity: Not Currently  Lifestyle  . Physical activity:    Days per week: Not on file    Minutes per session: Not on file  . Stress: Not on file   Relationships  . Social connections:    Talks on phone: Not on file    Gets together: Not on file    Attends religious service: Not on file    Active member of club or organization: Not on file    Attends meetings of clubs or organizations: Not on file    Relationship status: Not on file  Other Topics Concern  . Not on file  Social History Narrative  . Not on file   Family History: Family History  Problem Relation Age of Onset  . Stomach cancer Mother   . Brain cancer Mother   . Hypertension Mother   . Hypercholesterolemia Mother   . Hypercholesterolemia Father   . Diabetes Father   . Hypertension Father   . Liver disease Sister   . Heart attack Maternal Grandfather   . Brain cancer Maternal Grandfather   . Breast cancer Maternal Aunt    Allergies: Allergies  Allergen Reactions  . Oxycodone-Acetaminophen Other (See Comments)    Shaking uncontrolable  . Bupropion     Seizures   Medications: See med rec.  Review of Systems: No fevers, chills, night sweats, weight loss, chest pain, or shortness of breath.   Objective:  General: Well Developed, well nourished, and in no acute distress.  Neuro: Alert and oriented x3, extra-ocular muscles intact, sensation grossly intact.  HEENT: Normocephalic, atraumatic, pupils equal round reactive to light, neck supple, no masses, no lymphadenopathy, thyroid nonpalpable.  Skin: Warm and dry, no rashes. Cardiac: Regular rate and rhythm, no murmurs rubs or gallops, no lower extremity edema.  Respiratory: Clear to auscultation bilaterally. Not using accessory muscles, speaking in full sentences. Right shin: There is a black eschar with surrounding erythema and swelling.  Wound care/debridement: Size: 7 cm x 7 cm = 49 cm I used 10 cc of lidocaine with epinephrine under the eschar.  I then cleaned it with chlorhexidine and irrigated with saline.  Afterwards I remove the eschar and irrigated the wound, a large amount of necrotic fatty  tissue was removed, all the way down to the visible cribriform fascia.  I then applied a wet-to-dry dressing, and strapped with an Ace bandage.    Impression and Recommendations:    Wound of right leg Large full-thickness wound down to the tibialis anterior fascia/cribriform fascia. She will need a Psychiatric nurse, wound care, and possibly a wound VAC for this. I debrided the wound, I did a wet-to-dry dressing which she will do at home every day. Return to see me as needed for this.  I spent 25 minutes with this patient, greater than 50% was face-to-face time counseling regarding the above diagnoses, specifically the diagnosis and treatment options as well as anticipatory guidance regarding her large anterior shin wound, this was separate from the time spent performing the above wound debridement and care. ___________________________________________ Gwen Her. Dianah Field, M.D., ABFM., CAQSM. Primary Care and Sports Medicine [facility]  Adjunct Professor of [place] of [facility] [employer]

## 2018-04-23 DIAGNOSIS — Z9181 History of falling: Secondary | ICD-10-CM | POA: Diagnosis not present

## 2018-04-23 DIAGNOSIS — J449 Chronic obstructive pulmonary disease, unspecified: Secondary | ICD-10-CM | POA: Diagnosis not present

## 2018-04-23 DIAGNOSIS — I83008 Varicose veins of unspecified lower extremity with ulcer other part of lower leg: Secondary | ICD-10-CM | POA: Diagnosis not present

## 2018-04-23 DIAGNOSIS — G40909 Epilepsy, unspecified, not intractable, without status epilepticus: Secondary | ICD-10-CM | POA: Diagnosis not present

## 2018-04-23 DIAGNOSIS — T8133XA Disruption of traumatic injury wound repair, initial encounter: Secondary | ICD-10-CM | POA: Diagnosis not present

## 2018-04-23 DIAGNOSIS — I83019 Varicose veins of right lower extremity with ulcer of unspecified site: Secondary | ICD-10-CM | POA: Diagnosis not present

## 2018-04-29 DIAGNOSIS — S81801A Unspecified open wound, right lower leg, initial encounter: Secondary | ICD-10-CM | POA: Diagnosis not present

## 2018-05-09 DIAGNOSIS — S81801D Unspecified open wound, right lower leg, subsequent encounter: Secondary | ICD-10-CM | POA: Diagnosis not present

## 2018-05-20 DIAGNOSIS — T8133XA Disruption of traumatic injury wound repair, initial encounter: Secondary | ICD-10-CM | POA: Diagnosis not present

## 2018-05-27 DIAGNOSIS — S81801D Unspecified open wound, right lower leg, subsequent encounter: Secondary | ICD-10-CM | POA: Diagnosis not present

## 2018-06-07 DIAGNOSIS — S81801D Unspecified open wound, right lower leg, subsequent encounter: Secondary | ICD-10-CM | POA: Diagnosis not present

## 2018-06-10 DIAGNOSIS — T8133XA Disruption of traumatic injury wound repair, initial encounter: Secondary | ICD-10-CM | POA: Diagnosis not present

## 2018-06-17 ENCOUNTER — Other Ambulatory Visit: Payer: Self-pay | Admitting: Physician Assistant

## 2018-06-17 DIAGNOSIS — F339 Major depressive disorder, recurrent, unspecified: Secondary | ICD-10-CM

## 2018-06-17 DIAGNOSIS — S81801D Unspecified open wound, right lower leg, subsequent encounter: Secondary | ICD-10-CM | POA: Diagnosis not present

## 2018-07-09 DIAGNOSIS — S81801D Unspecified open wound, right lower leg, subsequent encounter: Secondary | ICD-10-CM | POA: Diagnosis not present

## 2018-07-10 ENCOUNTER — Other Ambulatory Visit: Payer: Self-pay | Admitting: Physician Assistant

## 2018-07-10 DIAGNOSIS — J42 Unspecified chronic bronchitis: Secondary | ICD-10-CM

## 2018-07-22 DIAGNOSIS — S81801D Unspecified open wound, right lower leg, subsequent encounter: Secondary | ICD-10-CM | POA: Diagnosis not present

## 2018-08-05 ENCOUNTER — Encounter: Payer: Self-pay | Admitting: Physician Assistant

## 2018-08-05 ENCOUNTER — Ambulatory Visit (INDEPENDENT_AMBULATORY_CARE_PROVIDER_SITE_OTHER): Payer: BLUE CROSS/BLUE SHIELD | Admitting: Physician Assistant

## 2018-08-05 VITALS — BP 128/72 | Ht 67.75 in | Wt 238.0 lb

## 2018-08-05 DIAGNOSIS — L089 Local infection of the skin and subcutaneous tissue, unspecified: Secondary | ICD-10-CM

## 2018-08-05 DIAGNOSIS — B958 Unspecified staphylococcus as the cause of diseases classified elsewhere: Secondary | ICD-10-CM

## 2018-08-05 DIAGNOSIS — J42 Unspecified chronic bronchitis: Secondary | ICD-10-CM

## 2018-08-05 DIAGNOSIS — I10 Essential (primary) hypertension: Secondary | ICD-10-CM

## 2018-08-05 DIAGNOSIS — R7303 Prediabetes: Secondary | ICD-10-CM

## 2018-08-05 DIAGNOSIS — F339 Major depressive disorder, recurrent, unspecified: Secondary | ICD-10-CM

## 2018-08-05 DIAGNOSIS — E7800 Pure hypercholesterolemia, unspecified: Secondary | ICD-10-CM

## 2018-08-05 MED ORDER — SERTRALINE HCL 100 MG PO TABS: 100.0000 mg | ORAL_TABLET | Freq: Every day | ORAL | 3 refills | Status: DC

## 2018-08-05 MED ORDER — SIMVASTATIN 40 MG PO TABS: 40.0000 mg | ORAL_TABLET | Freq: Every day | ORAL | 3 refills | Status: DC

## 2018-08-05 MED ORDER — FLUTICASONE-UMECLIDIN-VILANT 100-62.5-25 MCG/INH IN AEPB: 1.0000 | INHALATION_SPRAY | Freq: Every day | RESPIRATORY_TRACT | 3 refills | Status: DC

## 2018-08-05 MED ORDER — LISINOPRIL-HYDROCHLOROTHIAZIDE 20-12.5 MG PO TABS: 1.0000 | ORAL_TABLET | Freq: Every day | ORAL | 3 refills | Status: DC

## 2018-08-05 NOTE — Progress Notes (Signed)
Patient ID: Ashley Scott, female   DOB: 1955/04/17, 63 y.o.   MRN: [id number] .Marland KitchenVirtual Visit via Telephone Note  I connected with Ashley Scott on 08/06/18 at 10:50 AM EDT by telephone and verified that I am speaking with the correct person using two identifiers.  Location: Patient: work Provider: clinic   I discussed the limitations, risks, security and privacy concerns of performing an evaluation and management service by telephone and the availability of in person appointments. I also discussed with the patient that there may be a patient responsible charge related to this service. The patient expressed understanding and agreed to proceed.   History of Present Illness: Pt is a 63 yo female with COPD, HTN, pre-diabetes who calls into the clinic for refills.   Pt continues to recover from a staph infection of leg where she had to have a skin graph. She just got back to work. She is doing well.   COPD- breathing doing well. Continues to smoke. On Trelegy. Using as needed albuterol about twice a week.   HTN- doing well. No CP, palpitations, headaches or vision changes.   Mood is ok. covid pandemic has made things a little stressful but overall feels good.   .. Active Ambulatory Problems    Diagnosis Date Noted  . COPD (chronic obstructive pulmonary disease) ([place]) 10/03/2017  . Hypercholesteremia 10/03/2017  . Adenomatous polyp 10/03/2017  . Family history of heart failure 10/04/2017  . SOB (shortness of breath) on exertion 10/04/2017  . Current smoker 10/04/2017  . Bilateral impacted cerumen 10/04/2017  . Vitamin D insufficiency 10/04/2017  . Essential hypertension 10/04/2017  . Depression, recurrent ([place]) 10/04/2017  . Seasonal allergies 10/04/2017  . Obesity (BMI 30-39.9) 10/04/2017  . Bilateral lower extremity edema 10/08/2017  . Orthopnea 10/08/2017  . Left leg pain 10/08/2017  . Tricuspid regurgitation 10/16/2017  . Class 2 obesity due to excess calories without serious  comorbidity with body mass index (BMI) of 35.0 to 35.9 in adult 11/02/2017  . OAB (overactive bladder) 11/02/2017  . Elevated fasting glucose 02/04/2018  . Fracture of radius, left, closed 03/15/2018  . Wound of right leg 04/19/2018  . Staphylococcal infection of skin 08/06/2018   Resolved Ambulatory Problems    Diagnosis Date Noted  . No Resolved Ambulatory Problems   Past Medical History:  Diagnosis Date  . Anxiety   . Asthma   . Herpes   . Hypertension   . Seizure-like activity ([place])     Reviewed med, allergy, problem list.      Observations/Objective: No acute distress. Normal breathing.  Normal appearance.   .. Depression screen [facility] 2/9 08/05/2018 10/08/2017  Decreased Interest 0 2  Down, Depressed, Hopeless 0 2  PHQ - 2 Score 0 4  Altered sleeping 3 3  Tired, decreased energy 3 3  Change in appetite 1 1  Feeling bad or failure about yourself  1 2  Trouble concentrating 2 1  Moving slowly or fidgety/restless 0 0  Suicidal thoughts 0 0  PHQ-9 Score 10 14  Difficult doing work/chores Not difficult at all Somewhat difficult   .Marland Kitchen GAD 7 : Generalized Anxiety Score 08/05/2018  Nervous, Anxious, on Edge 2  Control/stop worrying 3  Worry too much - different things 3  Trouble relaxing 3  Restless 0  Easily annoyed or irritable 1  Afraid - awful might happen 0  Total GAD 7 Score 12  Anxiety Difficulty Not difficult at all     Assessment and Plan: Marland KitchenMarland KitchenAkemi was seen  today for follow-up.  Diagnoses and all orders for this visit:  Essential hypertension -     lisinopril-hydrochlorothiazide (ZESTORETIC) 20-12.5 MG tablet; Take 1 tablet by mouth daily.  Depression, recurrent ([place]) -     sertraline (ZOLOFT) 100 MG tablet; Take 1 tablet (100 mg total) by mouth daily.  Chronic bronchitis, unspecified chronic bronchitis type ([place]) -     Fluticasone-Umeclidin-Vilant (TRELEGY ELLIPTA) 100-62.5-25 MCG/INH AEPB; Inhale 1 puff into the lungs daily.  Staphylococcal  infection of skin  Hypercholesteremia -     simvastatin (ZOCOR) 40 MG tablet; Take 1 tablet (40 mg total) by mouth at bedtime.  Labs up to date.   Doing well. Refilled medications.   Pt does not want any changes to zoloft. Discussed ways to reduce stress.   Continue to follow up with surgeon for wound healing.   Encouraged patient to stop smoking. Not motivated at this time.   Follow up in 6 months.    Follow Up Instructions:    I discussed the assessment and treatment plan with the patient. The patient was provided an opportunity to ask questions and all were answered. The patient agreed with the plan and demonstrated an understanding of the instructions.   The patient was advised to call back or seek an in-person evaluation if the symptoms worsen or if the condition fails to improve as anticipated.  I provided 25 minutes of non-face-to-face time during this encounter.   Iran Planas, PA-C

## 2018-08-05 NOTE — Progress Notes (Deleted)
Doing okay. PHQ9-GAD7 completed. BP have been doing well.

## 2018-08-06 ENCOUNTER — Encounter: Payer: Self-pay | Admitting: Physician Assistant

## 2018-08-06 DIAGNOSIS — B958 Unspecified staphylococcus as the cause of diseases classified elsewhere: Secondary | ICD-10-CM | POA: Insufficient documentation

## 2018-08-06 DIAGNOSIS — L089 Local infection of the skin and subcutaneous tissue, unspecified: Secondary | ICD-10-CM | POA: Insufficient documentation

## 2018-08-06 DIAGNOSIS — R7303 Prediabetes: Secondary | ICD-10-CM | POA: Insufficient documentation

## 2018-08-28 ENCOUNTER — Other Ambulatory Visit: Payer: Self-pay | Admitting: Physician Assistant

## 2018-08-28 DIAGNOSIS — J42 Unspecified chronic bronchitis: Secondary | ICD-10-CM

## 2018-08-29 NOTE — Telephone Encounter (Signed)
RX is from historical provider. She has been prescribed Trelegy Ellipta by our office, no notes regarding this RX. Please advise on refill.

## 2018-10-22 DIAGNOSIS — S81801D Unspecified open wound, right lower leg, subsequent encounter: Secondary | ICD-10-CM | POA: Diagnosis not present

## 2018-11-27 ENCOUNTER — Other Ambulatory Visit: Payer: Self-pay

## 2018-11-27 ENCOUNTER — Ambulatory Visit (INDEPENDENT_AMBULATORY_CARE_PROVIDER_SITE_OTHER): Payer: BC Managed Care – PPO | Admitting: Physician Assistant

## 2018-11-27 ENCOUNTER — Encounter: Payer: Self-pay | Admitting: Physician Assistant

## 2018-11-27 VITALS — BP 131/78 | HR 75 | Temp 98.7°F | Ht 67.5 in | Wt 237.0 lb

## 2018-11-27 DIAGNOSIS — R1013 Epigastric pain: Secondary | ICD-10-CM

## 2018-11-27 DIAGNOSIS — R110 Nausea: Secondary | ICD-10-CM

## 2018-11-27 DIAGNOSIS — M25561 Pain in right knee: Secondary | ICD-10-CM

## 2018-11-27 DIAGNOSIS — R52 Pain, unspecified: Secondary | ICD-10-CM | POA: Diagnosis not present

## 2018-11-27 DIAGNOSIS — R600 Localized edema: Secondary | ICD-10-CM | POA: Diagnosis not present

## 2018-11-27 DIAGNOSIS — G8929 Other chronic pain: Secondary | ICD-10-CM

## 2018-11-27 MED ORDER — LIDOCAINE VISCOUS HCL 2 % MT SOLN
15.0000 mL | Freq: Once | OROMUCOSAL | Status: AC
  Administered 2018-11-27: 11:00:00 15 mL via OROMUCOSAL

## 2018-11-27 MED ORDER — ALUM & MAG HYDROXIDE-SIMETH 200-200-20 MG/5ML PO SUSP
30.0000 mL | Freq: Once | ORAL | Status: AC
  Administered 2018-11-27: 11:00:00 30 mL via ORAL

## 2018-11-27 MED ORDER — OMEPRAZOLE 40 MG PO CPDR: 40.0000 mg | DELAYED_RELEASE_CAPSULE | Freq: Every day | ORAL | 3 refills | Status: DC

## 2018-11-27 MED ORDER — SUCRALFATE 1 G PO TABS: ORAL_TABLET | ORAL | 0 refills | Status: DC

## 2018-11-27 MED ORDER — HYOSCYAMINE SULFATE 0.125 MG PO TBDP
0.1250 mg | ORAL_TABLET | Freq: Once | ORAL | Status: AC
  Administered 2018-11-27: 0.125 mg via SUBLINGUAL

## 2018-11-27 MED ORDER — FUROSEMIDE 20 MG PO TABS: ORAL_TABLET | ORAL | 0 refills | Status: DC

## 2018-11-27 MED ORDER — DICLOFENAC SODIUM 1 % TD GEL: 4.0000 g | Freq: Four times a day (QID) | TRANSDERMAL | 1 refills | Status: DC

## 2018-11-27 NOTE — Progress Notes (Signed)
Subjective:    Patient ID: Ashley Scott, female    DOB: 05/07/1955, 63 y.o.   MRN: [id number]  HPI  Pt is a 63 yo obese female and current daily smoker who presents to the clinic with epigastric pain/fullness and nausea that started Sunday night, 4 days ago. Since then she has felt a pressure in her epigastric area. No hx of GERD. Discomfort is persistent and seems to be worse when she eats. She admits to some nausea and some bile tasting acid in her mouth. She denies any bowel changes. She denies any melena or hematochezia. She does still have her gallbladder.she has never had anything like this before. She denies any lower abdominal or urinary symptoms. She has not had any medication or diet changes. She does admit to taking a lot of aleve daily for her right knee pain and swelling. She has been taking multiple times a day for many months.   .. Active Ambulatory Problems    Diagnosis Date Noted  . COPD (chronic obstructive pulmonary disease) ([place]) 10/03/2017  . Hypercholesteremia 10/03/2017  . Adenomatous polyp 10/03/2017  . Family history of heart failure 10/04/2017  . SOB (shortness of breath) on exertion 10/04/2017  . Current smoker 10/04/2017  . Bilateral impacted cerumen 10/04/2017  . Vitamin D insufficiency 10/04/2017  . Essential hypertension 10/04/2017  . Depression, recurrent ([place]) 10/04/2017  . Seasonal allergies 10/04/2017  . Obesity (BMI 30-39.9) 10/04/2017  . Bilateral lower extremity edema 10/08/2017  . Orthopnea 10/08/2017  . Left leg pain 10/08/2017  . Tricuspid regurgitation 10/16/2017  . Class 2 obesity due to excess calories without serious comorbidity with body mass index (BMI) of 35.0 to 35.9 in adult 11/02/2017  . OAB (overactive bladder) 11/02/2017  . Elevated fasting glucose 02/04/2018  . Fracture of radius, left, closed 03/15/2018  . Wound of right leg 04/19/2018  . Staphylococcal infection of skin 08/06/2018  . Pre-diabetes 08/06/2018   Resolved  Ambulatory Problems    Diagnosis Date Noted  . No Resolved Ambulatory Problems   Past Medical History:  Diagnosis Date  . Anxiety   . Asthma   . Herpes   . Hypertension   . Seizure-like activity ([place])      Review of Systems See HPI>     Objective:   Physical Exam Vitals signs reviewed.  Constitutional:      Appearance: She is well-developed. She is obese.  HENT:     Head: Normocephalic.  Cardiovascular:     Rate and Rhythm: Normal rate and regular rhythm.  Pulmonary:     Effort: Pulmonary effort is normal.     Breath sounds: Normal breath sounds.  Abdominal:     General: Bowel sounds are normal. There is distension.     Palpations: Abdomen is soft. There is no mass.     Tenderness: There is abdominal tenderness in the right upper quadrant and epigastric area. There is guarding. There is no right CVA tenderness or left CVA tenderness. Negative signs include Murphy's sign.     Hernia: No hernia is present.  Neurological:     General: No focal deficit present.     Mental Status: She is alert and oriented to person, place, and time.  Psychiatric:        Mood and Affect: Mood normal.           Assessment & Plan:  Marland KitchenMarland KitchenRashay was seen today for abdominal pain.  Diagnoses and all orders for this visit:  Epigastric pain -  CBC with Differential/Platelet -     COMPLETE METABOLIC PANEL WITH GFR -     Lipase -     sucralfate (CARAFATE) 1 g tablet; Take one tablet three times a day with meals. -     omeprazole (PRILOSEC) 40 MG capsule; Take 1 capsule (40 mg total) by mouth daily. -     H. pylori breath test -     hyoscyamine (ANASPAZ) disintergrating tablet 0.125 mg -     lidocaine (XYLOCAINE) 2 % viscous mouth solution 15 mL -     alum & mag hydroxide-simeth (MAALOX/MYLANTA) 200-200-20 MG/5ML suspension 30 mL  Nausea -     CBC with Differential/Platelet -     COMPLETE METABOLIC PANEL WITH GFR -     Lipase -     sucralfate (CARAFATE) 1 g tablet; Take one tablet  three times a day with meals. -     omeprazole (PRILOSEC) 40 MG capsule; Take 1 capsule (40 mg total) by mouth daily. -     H. pylori breath test -     hyoscyamine (ANASPAZ) disintergrating tablet 0.125 mg -     lidocaine (XYLOCAINE) 2 % viscous mouth solution 15 mL -     alum & mag hydroxide-simeth (MAALOX/MYLANTA) 200-200-20 MG/5ML suspension 30 mL  Pain aggravated by eating or drinking -     CBC with Differential/Platelet -     COMPLETE METABOLIC PANEL WITH GFR -     Lipase -     sucralfate (CARAFATE) 1 g tablet; Take one tablet three times a day with meals. -     omeprazole (PRILOSEC) 40 MG capsule; Take 1 capsule (40 mg total) by mouth daily. -     H. pylori breath test -     hyoscyamine (ANASPAZ) disintergrating tablet 0.125 mg -     lidocaine (XYLOCAINE) 2 % viscous mouth solution 15 mL -     alum & mag hydroxide-simeth (MAALOX/MYLANTA) 200-200-20 MG/5ML suspension 30 mL  Lower leg edema -     furosemide (LASIX) 20 MG tablet; Take one tablet as needed for lower extremity swelling.  Chronic pain of right knee -     diclofenac sodium (VOLTAREN) 1 % GEL; Apply 4 g topically 4 (four) times daily. To affected joint.   Unclear etiology at this point but suspect NSAID induced gastritis. I went ahead and got a h.pylori breath test before starting PPI. Cannot rule out pancreatitis/cholecystitis. No evidence of any GI bleed.  Will get cbc, cmp, lipase in office today. STOP aleve. Given GI cocktail before she left. Start omeprazole and carafate for 6-8 weeks.   For knee pain start diclofenac gel up to 4 times a day as needed. likely arthritis may consider sports medicine and knee injection but would need xray first.   For lower extremity edema keep foot elevated. Consider compression socking's. Use lasix as needed.   Follow up in 6 weeks.   Marland Kitchen.Spent 30 minutes with patient and greater than 50 percent of visit spent counseling patient regarding treatment plan.

## 2018-11-27 NOTE — Patient Instructions (Addendum)
If not improving will order u/s of abdomen please call back.   Start omeprazole once daily for the next 6-8 weeks and carafate three times a day with meals for next 4 weeks.  STOP ALEVE.   Use diclofenac for joint pain.  Use lasix as needed for leg swelling.  Consider keeping feet elevated and compression stockings could help.    Gastritis, Adult  Gastritis is swelling (inflammation) of the stomach. Gastritis can develop quickly (acute). It can also develop slowly over time (chronic). It is important to get help for this condition. If you do not get help, your stomach can bleed, and you can get sores (ulcers) in your stomach. What are the causes? This condition may be caused by:  Germs that get to your stomach.  Drinking too much alcohol.  Medicines you are taking.  Too much acid in the stomach.  A disease of the intestines or stomach.  Stress.  An allergic reaction.  Crohn's disease.  Some cancer treatments (radiation). Sometimes the cause of this condition is not known. What are the signs or symptoms? Symptoms of this condition include:  Pain in your stomach.  A burning feeling in your stomach.  Feeling sick to your stomach (nauseous).  Throwing up (vomiting).  Feeling too full after you eat.  Weight loss.  Bad breath.  Throwing up blood.  Blood in your poop (stool). How is this diagnosed? This condition may be diagnosed with:  Your medical history and symptoms.  A physical exam.  Tests. These can include: ? Blood tests. ? Stool tests. ? A procedure to look inside your stomach (upper endoscopy). ? A test in which a sample of tissue is taken for testing (biopsy). How is this treated? Treatment for this condition depends on what caused it. You may be given:  Antibiotic medicine, if your condition was caused by germs.  H2 blockers and similar medicines, if your condition was caused by too much acid. Follow these instructions at  home: Medicines  Take over-the-counter and prescription medicines only as told by your doctor.  If you were prescribed an antibiotic medicine, take it as told by your doctor. Do not stop taking it even if you start to feel better. Eating and drinking   Eat small meals often, instead of large meals.  Avoid foods and drinks that make your symptoms worse.  Drink enough fluid to keep your pee (urine) pale yellow. Alcohol use  Do not drink alcohol if: ? Your doctor tells you not to drink. ? You are pregnant, may be pregnant, or are planning to become pregnant.  If you drink alcohol: ? Limit your use to:  0-1 drink a day for women.  0-2 drinks a day for men. ? Be aware of how much alcohol is in your drink. In the [place] one drink equals one 12 oz bottle of beer (355 mL), one 5 oz glass of wine (148 mL), or one 1 oz glass of hard liquor (44 mL). General instructions  Talk with your doctor about ways to manage stress. You can exercise or do deep breathing, meditation, or yoga.  Do not smoke or use products that have nicotine or tobacco. If you need help quitting, ask your doctor.  Keep all follow-up visits as told by your doctor. This is important. Contact a doctor if:  Your symptoms get worse.  Your symptoms go away and then come back. Get help right away if:  You throw up blood or something that looks like coffee  grounds.  You have black or dark red poop.  You throw up any time you try to drink fluids.  Your stomach pain gets worse.  You have a fever.  You do not feel better after one week. Summary  Gastritis is swelling (inflammation) of the stomach.  You must get help for this condition. If you do not get help, your stomach can bleed, and you can get sores (ulcers).  This condition is diagnosed with medical history, physical exam, or tests.  You can be treated with medicines for germs or medicines to block too much acid in your stomach. This information is not  intended to replace advice given to you by your health care provider. Make sure you discuss any questions you have with your health care provider. Document Released: 09/06/2007 Document Revised: 08/07/2017 Document Reviewed: 08/07/2017 Elsevier Patient Education  2020 [employer].

## 2018-11-28 LAB — COMPLETE METABOLIC PANEL WITH GFR
AG Ratio: 2.3 (calc) (ref 1.0–2.5)
ALT: 13 U/L (ref 6–29)
AST: 13 U/L (ref 10–35)
Albumin: 4.5 g/dL (ref 3.6–5.1)
Alkaline phosphatase (APISO): 68 U/L (ref 37–153)
BUN: 8 mg/dL (ref 7–25)
CO2: 31 mmol/L (ref 20–32)
Calcium: 10 mg/dL (ref 8.6–10.4)
Chloride: 100 mmol/L (ref 98–110)
Creat: 0.72 mg/dL (ref 0.50–0.99)
GFR, Est African American: 103 mL/min/{1.73_m2} (ref >=60)
GFR, Est Non African American: 89 mL/min/{1.73_m2} (ref >=60)
Globulin: 2 g/dL (calc) (ref 1.9–3.7)
Glucose, Bld: 107 mg/dL — ABNORMAL HIGH (ref 65–99)
Potassium: 4.6 mmol/L (ref 3.5–5.3)
Sodium: 137 mmol/L (ref 135–146)
Total Bilirubin: 0.5 mg/dL (ref 0.2–1.2)
Total Protein: 6.5 g/dL (ref 6.1–8.1)

## 2018-11-28 LAB — CBC WITH DIFFERENTIAL/PLATELET
Absolute Monocytes: 498 cells/uL (ref 200–950)
Basophils Absolute: 53 cells/uL (ref 0–200)
Basophils Relative: 0.6 %
Eosinophils Absolute: 98 cells/uL (ref 15–500)
Eosinophils Relative: 1.1 %
HCT: 46.3 % — ABNORMAL HIGH (ref 35.0–45.0)
Hemoglobin: 15.3 g/dL (ref 11.7–15.5)
Lymphs Abs: 2172 cells/uL (ref 850–3900)
MCH: 31 pg (ref 27.0–33.0)
MCHC: 33 g/dL (ref 32.0–36.0)
MCV: 93.7 fL (ref 80.0–100.0)
MPV: 10.9 fL (ref 7.5–12.5)
Monocytes Relative: 5.6 %
Neutro Abs: 6079 cells/uL (ref 1500–7800)
Neutrophils Relative %: 68.3 %
Platelets: 320 10*3/uL (ref 140–400)
RBC: 4.94 10*6/uL (ref 3.80–5.10)
RDW: 11.9 % (ref 11.0–15.0)
Total Lymphocyte: 24.4 %
WBC: 8.9 10*3/uL (ref 3.8–10.8)

## 2018-11-28 LAB — LIPASE: Lipase: 18 U/L (ref 7–60)

## 2018-11-28 LAB — H. PYLORI BREATH TEST: H. pylori Breath Test: NOT DETECTED

## 2018-11-28 NOTE — Progress Notes (Signed)
No acute findings on labs. Are your symptoms any better this morning?

## 2018-11-29 NOTE — Progress Notes (Signed)
H. Pylori not detected. Suspect this is gastritis from aleve. Follow up in 4-6 weeks to confirm complete resolution of symptoms.

## 2018-12-20 ENCOUNTER — Other Ambulatory Visit: Payer: Self-pay | Admitting: Physician Assistant

## 2018-12-20 DIAGNOSIS — R600 Localized edema: Secondary | ICD-10-CM

## 2018-12-20 DIAGNOSIS — R110 Nausea: Secondary | ICD-10-CM

## 2018-12-20 DIAGNOSIS — R52 Pain, unspecified: Secondary | ICD-10-CM

## 2018-12-20 DIAGNOSIS — R1013 Epigastric pain: Secondary | ICD-10-CM

## 2019-01-16 ENCOUNTER — Other Ambulatory Visit: Payer: Self-pay | Admitting: Physician Assistant

## 2019-01-16 DIAGNOSIS — R600 Localized edema: Secondary | ICD-10-CM

## 2019-01-16 NOTE — Telephone Encounter (Signed)
Last filled 12/20/2018 #30 with no refills.  States take one daily PRN. Please advise on refill.

## 2019-01-23 ENCOUNTER — Encounter: Payer: Self-pay | Admitting: Physician Assistant

## 2019-01-23 ENCOUNTER — Encounter: Payer: Self-pay | Admitting: Family Medicine

## 2019-01-23 ENCOUNTER — Other Ambulatory Visit: Payer: Self-pay

## 2019-01-23 ENCOUNTER — Ambulatory Visit (INDEPENDENT_AMBULATORY_CARE_PROVIDER_SITE_OTHER): Payer: BC Managed Care – PPO | Admitting: Family Medicine

## 2019-01-23 VITALS — Temp 100.4°F | Wt 235.0 lb

## 2019-01-23 DIAGNOSIS — R05 Cough: Secondary | ICD-10-CM

## 2019-01-23 DIAGNOSIS — R059 Cough, unspecified: Secondary | ICD-10-CM

## 2019-01-23 MED ORDER — AZITHROMYCIN 250 MG PO TABS: 250.0000 mg | ORAL_TABLET | Freq: Every day | ORAL | 0 refills | Status: DC

## 2019-01-23 MED ORDER — PREDNISONE 50 MG PO TABS: 50.0000 mg | ORAL_TABLET | Freq: Every day | ORAL | 0 refills | Status: DC

## 2019-01-23 NOTE — Telephone Encounter (Signed)
Called Pt and scheduled for a virtual visit today

## 2019-01-23 NOTE — Progress Notes (Signed)
Virtual Visit  I connected with      Ashley Scott  by a telemedicine application and verified that I am speaking with the correct person using two identifiers.   I discussed the limitations of evaluation and management by telemedicine and the availability of in person appointments. The patient expressed understanding and agreed to proceed.  History of Present Illness: Ashley Scott is a 63 y.o. female who would like to discuss headache body aches fevers chills wheezing.  Symptoms started Monday, October 19.  She felt a bit better on Tuesday and will back to work but Tuesday evening her symptoms worsened again.  She notes temperature as high as 100.4 F.  She has a bit of wheezing and cough and some mild shortness of breath.  She notes she has a history of asthma and COPD and typically does have some wheezing and mild shortness of breath at baseline.  She is tried using her albuterol inhaler which helps some.  Symptoms are somewhat consistent with previous episodes of bronchitis that she typically gets in the fall as well.    No known Covid contacts..  Observations/Objective: Temp (!) 100.4 F (38 C) (Oral)   Wt 235 lb (106.6 kg)   BMI 36.26 kg/m  Wt Readings from Last 5 Encounters:  01/23/19 235 lb (106.6 kg)  11/27/18 237 lb (107.5 kg)  08/05/18 238 lb (108 kg)  04/19/18 238 lb (108 kg)  04/05/18 240 lb (108.9 kg)   Exam: Normal Speech.  No significant shortness of breath or tachypnea.  Occasional coughing.  Able to complete sentences.  Alert and oriented.  Lab and Radiology Results No results found for this or any previous visit (from the past 72 hour(s)). No results found.   Assessment and Plan: 63 y.o. female with URI symptoms in the setting of COPD concerning for COPD exacerbation bronchitis or pneumonia.  Obviously also concerning for COVID-19.  Plan for outpatient COVID-19 testing as well as empiric treatment for COPD exacerbation/bronchitis/pneumonia with prednisone and  azithromycin antibiotics as well as continued albuterol.  Precautions reviewed.  Self-isolation discussed until test results are back.   PDMP not reviewed this encounter. No orders of the defined types were placed in this encounter.  No orders of the defined types were placed in this encounter.   Follow Up Instructions:    I discussed the assessment and treatment plan with the patient. The patient was provided an opportunity to ask questions and all were answered. The patient agreed with the plan and demonstrated an understanding of the instructions.   The patient was advised to call back or seek an in-person evaluation if the symptoms worsen or if the condition fails to improve as anticipated.  Time: 15 minutes of intraservice time, with >22 minutes of total time during today's visit.      Historical information moved to improve visibility of documentation.  Past Medical History:  Diagnosis Date  . Anxiety   . Asthma   . COPD (chronic obstructive pulmonary disease) ([place])   . Herpes   . Hypercholesteremia   . Hypertension   . Seizure-like activity [facility])    Past Surgical History:  Procedure Laterality Date  . PARTIAL HYSTERECTOMY    . right wrist surgery    . TONSILLECTOMY AND ADENOIDECTOMY    . WISDOM TOOTH EXTRACTION     Social History   Tobacco Use  . Smoking status: Current Every Day Smoker    Packs/day: 1.00    Types: Cigarettes  .  Smokeless tobacco: Never Used  Substance Use Topics  . Alcohol use: Yes    Comment: very rarely   family history includes Brain cancer in her maternal grandfather and mother; Breast cancer in her maternal aunt; Diabetes in her father; Heart attack in her maternal grandfather; Hypercholesterolemia in her father and mother; Hypertension in her father and mother; Liver disease in her sister; Stomach cancer in her mother.  Medications: Current Outpatient Medications  Medication Sig Dispense Refill  . cetirizine HCl (ZYRTEC) 1 MG/ML  solution Take by mouth daily.    . diclofenac sodium (VOLTAREN) 1 % GEL Apply 4 g topically 4 (four) times daily. To affected joint. 100 g 1  . Fluticasone-Umeclidin-Vilant (TRELEGY ELLIPTA) 100-62.5-25 MCG/INH AEPB Inhale 1 puff into the lungs daily. 90 each 3  . furosemide (LASIX) 20 MG tablet TAKE ONE TABLET AS NEEDED FOR LOWER EXTREMITY SWELLING. 90 tablet 1  . lisinopril-hydrochlorothiazide (ZESTORETIC) 20-12.5 MG tablet Take 1 tablet by mouth daily. 90 tablet 3  . omeprazole (PRILOSEC) 40 MG capsule Take 1 capsule (40 mg total) by mouth daily. 30 capsule 3  . sertraline (ZOLOFT) 100 MG tablet Take 1 tablet (100 mg total) by mouth daily. 90 tablet 3  . simvastatin (ZOCOR) 40 MG tablet Take 1 tablet (40 mg total) by mouth at bedtime. 90 tablet 3  . sucralfate (CARAFATE) 1 g tablet Take one tablet three times a day with meals. 90 tablet 0  . VENTOLIN HFA 108 (90 Base) MCG/ACT inhaler TAKE 2 PUFFS BY MOUTH EVERY 6 HOURS AS NEEDED FOR WHEEZE 18 Inhaler 5   No current facility-administered medications for this visit.    Allergies  Allergen Reactions  . Oxycodone-Acetaminophen Other (See Comments)    Shaking uncontrolable  . Bupropion     Seizures

## 2019-01-24 DIAGNOSIS — U071 COVID-19: Secondary | ICD-10-CM | POA: Diagnosis not present

## 2019-03-05 ENCOUNTER — Encounter: Payer: Self-pay | Admitting: Physician Assistant

## 2019-03-05 ENCOUNTER — Ambulatory Visit (INDEPENDENT_AMBULATORY_CARE_PROVIDER_SITE_OTHER): Payer: BLUE CROSS/BLUE SHIELD | Admitting: Physician Assistant

## 2019-03-05 VITALS — Temp 96.8°F | Ht 67.5 in | Wt 235.0 lb

## 2019-03-05 DIAGNOSIS — Z8619 Personal history of other infectious and parasitic diseases: Secondary | ICD-10-CM

## 2019-03-05 DIAGNOSIS — R059 Cough, unspecified: Secondary | ICD-10-CM

## 2019-03-05 DIAGNOSIS — R05 Cough: Secondary | ICD-10-CM | POA: Diagnosis not present

## 2019-03-05 DIAGNOSIS — J441 Chronic obstructive pulmonary disease with (acute) exacerbation: Secondary | ICD-10-CM

## 2019-03-05 DIAGNOSIS — R0602 Shortness of breath: Secondary | ICD-10-CM

## 2019-03-05 DIAGNOSIS — Z8616 Personal history of COVID-19: Secondary | ICD-10-CM

## 2019-03-05 MED ORDER — PREDNISONE 20 MG PO TABS: ORAL_TABLET | ORAL | 0 refills | Status: DC

## 2019-03-05 MED ORDER — DOXYCYCLINE HYCLATE 100 MG PO TABS: 100.0000 mg | ORAL_TABLET | Freq: Two times a day (BID) | ORAL | 0 refills | Status: DC

## 2019-03-05 NOTE — Progress Notes (Signed)
Patient ID: Ashley Scott, female   DOB: [birth year], 63 y.o.   MRN: [id number] .Marland KitchenVirtual Visit via Telephone Note  I connected with Ashley Scott on 03/05/19 at 11:30 AM EST by telephone and verified that I am speaking with the correct person using two identifiers.  Location: Patient: home Provider: clinic   I discussed the limitations, risks, security and privacy concerns of performing an evaluation and management service by telephone and the availability of in person appointments. I also discussed with the patient that there may be a patient responsible charge related to this service. The patient expressed understanding and agreed to proceed.   History of Present Illness: Pt is a 63 yo female with COPD and recent history of Covid at the end of October.  She reports 2-1/2 weeks of extreme fatigue, weakness, coughing.  At the very beginning she did take Z-Pak and prednisone burst.  She has gotten steadily better but the cough seems to be persistent and maybe even getting worse.  She has no energy.  She continues to use albuterol multiple times a day.  She continues to use Trelegy daily.  She feels pretty congested.  She gets short of breath easily.  She does have some intermittent minimal left leg swelling but she has had that for a while.    .. Active Ambulatory Problems    Diagnosis Date Noted  . COPD (chronic obstructive pulmonary disease) ([place]) 10/03/2017  . Hypercholesteremia 10/03/2017  . Adenomatous polyp 10/03/2017  . Family history of heart failure 10/04/2017  . SOB (shortness of breath) on exertion 10/04/2017  . Current smoker 10/04/2017  . Bilateral impacted cerumen 10/04/2017  . Vitamin D insufficiency 10/04/2017  . Essential hypertension 10/04/2017  . Depression, recurrent ([place]) 10/04/2017  . Seasonal allergies 10/04/2017  . Obesity (BMI 30-39.9) 10/04/2017  . Bilateral lower extremity edema 10/08/2017  . Orthopnea 10/08/2017  . Left leg pain 10/08/2017  . Tricuspid  regurgitation 10/16/2017  . Class 2 obesity due to excess calories without serious comorbidity with body mass index (BMI) of 35.0 to 35.9 in adult 11/02/2017  . OAB (overactive bladder) 11/02/2017  . Elevated fasting glucose 02/04/2018  . Fracture of radius, left, closed 03/15/2018  . Wound of right leg 04/19/2018  . Staphylococcal infection of skin 08/06/2018  . Pre-diabetes 08/06/2018   Resolved Ambulatory Problems    Diagnosis Date Noted  . No Resolved Ambulatory Problems   Past Medical History:  Diagnosis Date  . Anxiety   . Asthma   . Herpes   . Hypertension   . Seizure-like activity ([place])    Reviewed med, allergy, problem list.   Observations/Objective: No acute distress. Productive cough and expiratory wheezing heard over the phone.   .. Today's Vitals   03/05/19 1053  Temp: (!) 96.8 F (36 C)  TempSrc: Oral  Weight: 235 lb (106.6 kg)  Height: 5' 7.5" (1.715 m)   Body mass index is 36.26 kg/m.  Assessment and Plan: Marland KitchenMarland KitchenEsha was seen today for fatigue.  Diagnoses and all orders for this visit:  COPD exacerbation ([place]) -     predniSONE (DELTASONE) 20 MG tablet; Take 3 tablets for 3 days, take 2 tablets for 3 days, take 1 tablet for 3 days, take 1/2 tablet for 4 days. -     doxycycline (VIBRA-TABS) 100 MG tablet; Take 1 tablet (100 mg total) by mouth 2 (two) times daily.  SOB (shortness of breath) on exertion -     predniSONE (DELTASONE) 20 MG tablet; Take 3 tablets  for 3 days, take 2 tablets for 3 days, take 1 tablet for 3 days, take 1/2 tablet for 4 days. -     doxycycline (VIBRA-TABS) 100 MG tablet; Take 1 tablet (100 mg total) by mouth 2 (two) times daily.  Cough -     predniSONE (DELTASONE) 20 MG tablet; Take 3 tablets for 3 days, take 2 tablets for 3 days, take 1 tablet for 3 days, take 1/2 tablet for 4 days. -     doxycycline (VIBRA-TABS) 100 MG tablet; Take 1 tablet (100 mg total) by mouth 2 (two) times daily.  History of 2019 novel coronavirus  disease (COVID-19)   Pt continues to not feel 100 percent since covid. She did take zpak and prednisone for 5 days at end of October but has not had anything since.  I discussed the sequela of Covid symptoms and how it can take some time to get back to 100%.  Based off exam and conversation today I do think her COPD is in an exacerbation.  Treated with doxycycline and longer prednisone taper.  Continue to use albuterol as needed.  Continue use Mucinex to help break up the sputum.  She has minimal swelling only on one leg per patient.  Low suspicion of heart failure however cannot completely rule this out.  If she still having significant shortness of breath with exertion consider echo.  Written out of work for today and tomorrow to rest.    Follow Up Instructions:    I discussed the assessment and treatment plan with the patient. The patient was provided an opportunity to ask questions and all were answered. The patient agreed with the plan and demonstrated an understanding of the instructions.   The patient was advised to call back or seek an in-person evaluation if the symptoms worsen or if the condition fails to improve as anticipated.  I provided 20 minutes of non-face-to-face time during this encounter.   Iran Planas, PA-C

## 2019-03-05 NOTE — Progress Notes (Signed)
Ongoing congestion, cough, and fatigue Had Covid the end of October

## 2019-03-24 ENCOUNTER — Other Ambulatory Visit: Payer: Self-pay | Admitting: Physician Assistant

## 2019-03-24 DIAGNOSIS — R1013 Epigastric pain: Secondary | ICD-10-CM

## 2019-03-24 DIAGNOSIS — R52 Pain, unspecified: Secondary | ICD-10-CM

## 2019-03-24 DIAGNOSIS — R110 Nausea: Secondary | ICD-10-CM

## 2019-04-17 DIAGNOSIS — H5213 Myopia, bilateral: Secondary | ICD-10-CM | POA: Diagnosis not present

## 2019-05-06 ENCOUNTER — Other Ambulatory Visit: Payer: Self-pay | Admitting: Physician Assistant

## 2019-05-06 DIAGNOSIS — J42 Unspecified chronic bronchitis: Secondary | ICD-10-CM

## 2019-06-12 ENCOUNTER — Telehealth: Payer: Self-pay | Admitting: Neurology

## 2019-06-12 NOTE — Telephone Encounter (Signed)
Patient called and left vm stating she has a bladder infection and requesting antibiotics. Called patient and made appt for tomorrow to discuss. She did not want to come into the office.

## 2019-06-13 ENCOUNTER — Telehealth (INDEPENDENT_AMBULATORY_CARE_PROVIDER_SITE_OTHER): Payer: BLUE CROSS/BLUE SHIELD | Admitting: Physician Assistant

## 2019-06-13 ENCOUNTER — Encounter: Payer: Self-pay | Admitting: Physician Assistant

## 2019-06-13 VITALS — BP 128/69 | HR 87 | Temp 97.8°F | Ht 67.5 in | Wt 235.0 lb

## 2019-06-13 DIAGNOSIS — J441 Chronic obstructive pulmonary disease with (acute) exacerbation: Secondary | ICD-10-CM | POA: Diagnosis not present

## 2019-06-13 DIAGNOSIS — N3090 Cystitis, unspecified without hematuria: Secondary | ICD-10-CM

## 2019-06-13 DIAGNOSIS — R5382 Chronic fatigue, unspecified: Secondary | ICD-10-CM | POA: Diagnosis not present

## 2019-06-13 DIAGNOSIS — Z8616 Personal history of COVID-19: Secondary | ICD-10-CM | POA: Diagnosis not present

## 2019-06-13 MED ORDER — LEVOFLOXACIN 500 MG PO TABS: 500.0000 mg | ORAL_TABLET | Freq: Every day | ORAL | 0 refills | Status: DC

## 2019-06-13 MED ORDER — PREDNISONE 50 MG PO TABS: ORAL_TABLET | ORAL | 0 refills | Status: DC

## 2019-06-13 NOTE — Progress Notes (Signed)
Patient ID: Ashley Scott, female   DOB: 1955/12/26, 64 y.o.   MRN: [id number] .Marland KitchenVirtual Visit via Video Note  I connected with Larra Cacace on 06/13/2019 at  9:10 AM EST by a video enabled telemedicine application and verified that I am speaking with the correct person using two identifiers.  Location: Patient: home Provider: clinic   I discussed the limitations of evaluation and management by telemedicine and the availability of in person appointments. The patient expressed understanding and agreed to proceed.  History of Present Illness: Pt is a 64 yo female with COPd who presents to the clinic with multiple concerns.   She is coughing a lot and is productive. She is on trelegy daily but having some trouble breathing recently. She has not been the same since covid in October. She has had chronic fatigue. 2-3 days a week she almost does not feel like she can get out of bed. She is using albuterol every day.   2 days ago she suddenly had UTI symptoms of  Dysuria, frequency, urgency. No fever, nausea or vomiting, or flank pain.   .. Active Ambulatory Problems    Diagnosis Date Noted  . COPD (chronic obstructive pulmonary disease) ([place]) 10/03/2017  . Hypercholesteremia 10/03/2017  . Adenomatous polyp 10/03/2017  . Family history of heart failure 10/04/2017  . SOB (shortness of breath) on exertion 10/04/2017  . Current smoker 10/04/2017  . Bilateral impacted cerumen 10/04/2017  . Vitamin D insufficiency 10/04/2017  . Essential hypertension 10/04/2017  . Depression, recurrent ([place]) 10/04/2017  . Seasonal allergies 10/04/2017  . Obesity (BMI 30-39.9) 10/04/2017  . Bilateral lower extremity edema 10/08/2017  . Orthopnea 10/08/2017  . Left leg pain 10/08/2017  . Tricuspid regurgitation 10/16/2017  . Class 2 obesity due to excess calories without serious comorbidity with body mass index (BMI) of 35.0 to 35.9 in adult 11/02/2017  . OAB (overactive bladder) 11/02/2017  . Elevated fasting  glucose 02/04/2018  . Fracture of radius, left, closed 03/15/2018  . Wound of right leg 04/19/2018  . Staphylococcal infection of skin 08/06/2018  . Pre-diabetes 08/06/2018  . Chronic fatigue 06/13/2019  . Cystitis 06/13/2019   Resolved Ambulatory Problems    Diagnosis Date Noted  . No Resolved Ambulatory Problems   Past Medical History:  Diagnosis Date  . Anxiety   . Asthma   . Herpes   . Hypertension   . Seizure-like activity ([place])    Reviewed med, allergy, problem list.       Observations/Objective: No acute distress.  Normal mood and appearance.  Productive cough on exam.  No labored breathing or wheezing.   .. Today's Vitals   06/13/19 0859  BP: 128/69  Pulse: 87  Temp: 97.8 F (36.6 C)  TempSrc: Oral  SpO2: 95%  Weight: 235 lb (106.6 kg)  Height: 5' 7.5" (1.715 m)   Body mass index is 36.26 kg/m.      Assessment and Plan: Marland KitchenMarland KitchenJacqline was seen today for dysuria.  Diagnoses and all orders for this visit:  COPD exacerbation ([place]) -     predniSONE (DELTASONE) 50 MG tablet; One tab PO daily for 5 days. -     levofloxacin (LEVAQUIN) 500 MG tablet; Take 1 tablet (500 mg total) by mouth daily.  Chronic fatigue -     CBC with Differential/Platelet -     TSH -     B12 and Folate Panel -     VITAMIN D 25 Hydroxy (Vit-D Deficiency, Fractures) -     COMPLETE  METABOLIC PANEL WITH GFR -     CMV abs, IgG+IgM (cytomegalovirus) -     Epstein-Barr virus VCA antibody panel -     B. burgdorfi antibodies -     Fe+TIBC+Fer  Cystitis -     levofloxacin (LEVAQUIN) 500 MG tablet; Take 1 tablet (500 mg total) by mouth daily.  History of 2019 novel coronavirus disease (COVID-19)   Treated for COPD exacerbation with prednisone and levaquin. levaquin will also treat cystitis. Increase hydration.  Labs to look for other causes of fatigue. Certainly COPD can cause fatigue. Consider adding singulair to zyrtec.      Follow Up Instructions:    I discussed the  assessment and treatment plan with the patient. The patient was provided an opportunity to ask questions and all were answered. The patient agreed with the plan and demonstrated an understanding of the instructions.   The patient was advised to call back or seek an in-person evaluation if the symptoms worsen or if the condition fails to improve as anticipated.  I provided 15 minutes of non-face-to-face time during this encounter.   Iran Planas, PA-C

## 2019-06-13 NOTE — Progress Notes (Signed)
Started Wednesday:   Urgency Dysuria Urinary odor Back pain  Low energy

## 2019-06-16 NOTE — Progress Notes (Signed)
Ashley Scott,   Some labs still pending.   Kidney, liver, glucose look good.  Vitamin D and B12 low. Start vitamin D 1000units daily and b12 1055mcg daily. Recheck in 3 months.  Hemoglobin and hematocrit just a little elevated likely due to smoking.  Iron stores look good.   Luvenia Starch

## 2019-06-16 NOTE — Progress Notes (Signed)
Lymes negative.

## 2019-06-17 ENCOUNTER — Encounter: Payer: Self-pay | Admitting: Physician Assistant

## 2019-06-17 DIAGNOSIS — B2790 Infectious mononucleosis, unspecified without complication: Secondary | ICD-10-CM | POA: Insufficient documentation

## 2019-06-17 LAB — CBC WITH DIFFERENTIAL/PLATELET
Absolute Monocytes: 653 cells/uL (ref 200–950)
Basophils Absolute: 61 cells/uL (ref 0–200)
Basophils Relative: 0.6 %
Eosinophils Absolute: 82 cells/uL (ref 15–500)
Eosinophils Relative: 0.8 %
HCT: 49.7 % — ABNORMAL HIGH (ref 35.0–45.0)
Hemoglobin: 16.4 g/dL — ABNORMAL HIGH (ref 11.7–15.5)
Lymphs Abs: 2448 cells/uL (ref 850–3900)
MCH: 31.5 pg (ref 27.0–33.0)
MCHC: 33 g/dL (ref 32.0–36.0)
MCV: 95.6 fL (ref 80.0–100.0)
MPV: 11.6 fL (ref 7.5–12.5)
Monocytes Relative: 6.4 %
Neutro Abs: 6956 cells/uL (ref 1500–7800)
Neutrophils Relative %: 68.2 %
Platelets: 293 10*3/uL (ref 140–400)
RBC: 5.2 10*6/uL — ABNORMAL HIGH (ref 3.80–5.10)
RDW: 11.9 % (ref 11.0–15.0)
Total Lymphocyte: 24 %
WBC: 10.2 10*3/uL (ref 3.8–10.8)

## 2019-06-17 LAB — COMPLETE METABOLIC PANEL WITH GFR
AG Ratio: 1.8 (calc) (ref 1.0–2.5)
ALT: 12 U/L (ref 6–29)
AST: 11 U/L (ref 10–35)
Albumin: 4.4 g/dL (ref 3.6–5.1)
Alkaline phosphatase (APISO): 64 U/L (ref 37–153)
BUN: 11 mg/dL (ref 7–25)
CO2: 29 mmol/L (ref 20–32)
Calcium: 10.1 mg/dL (ref 8.6–10.4)
Chloride: 101 mmol/L (ref 98–110)
Creat: 0.66 mg/dL (ref 0.50–0.99)
GFR, Est African American: 108 mL/min/{1.73_m2} (ref >=60)
GFR, Est Non African American: 93 mL/min/{1.73_m2} (ref >=60)
Globulin: 2.4 g/dL (calc) (ref 1.9–3.7)
Glucose, Bld: 98 mg/dL (ref 65–99)
Potassium: 4.6 mmol/L (ref 3.5–5.3)
Sodium: 137 mmol/L (ref 135–146)
Total Bilirubin: 0.6 mg/dL (ref 0.2–1.2)
Total Protein: 6.8 g/dL (ref 6.1–8.1)

## 2019-06-17 LAB — EPSTEIN-BARR VIRUS VCA ANTIBODY PANEL
EBV NA IgG: >600 U/mL — ABNORMAL HIGH
EBV VCA IgG: 64.2 U/mL — ABNORMAL HIGH
EBV VCA IgM: 40.7 U/mL — ABNORMAL HIGH

## 2019-06-17 LAB — TSH: TSH: 1.24 mIU/L (ref 0.40–4.50)

## 2019-06-17 LAB — B12 AND FOLATE PANEL
Folate: 8.9 ng/mL
Vitamin B-12: 329 pg/mL (ref 200–1100)

## 2019-06-17 LAB — CMV ABS, IGG+IGM (CYTOMEGALOVIRUS)
CMV IgM: <30 AU/mL
Cytomegalovirus Ab-IgG: >10 U/mL — ABNORMAL HIGH

## 2019-06-17 LAB — B. BURGDORFI ANTIBODIES: B burgdorferi Ab IgG+IgM: <0.9 index

## 2019-06-17 LAB — IRON,TIBC AND FERRITIN PANEL
%SAT: 37 % (calc) (ref 16–45)
Ferritin: 73 ng/mL (ref 16–288)
Iron: 106 ug/dL (ref 45–160)
TIBC: 284 mcg/dL (calc) (ref 250–450)

## 2019-06-17 LAB — VITAMIN D 25 HYDROXY (VIT D DEFICIENCY, FRACTURES): Vit D, 25-Hydroxy: 27 ng/mL — ABNORMAL LOW (ref 30–100)

## 2019-06-17 NOTE — Progress Notes (Signed)
Ashley Scott,   Your blood work shows that you have mono. I think the fatigue is coming from that as well. The acute phase is 1-2 months of symptoms. Hopefully energy will begin to improve soon. Rest, hydrate, keep good nutrition.

## 2019-07-17 ENCOUNTER — Other Ambulatory Visit: Payer: Self-pay | Admitting: Physician Assistant

## 2019-07-17 DIAGNOSIS — R600 Localized edema: Secondary | ICD-10-CM

## 2019-07-17 DIAGNOSIS — Z20822 Contact with and (suspected) exposure to covid-19: Secondary | ICD-10-CM | POA: Diagnosis not present

## 2019-07-17 DIAGNOSIS — R52 Pain, unspecified: Secondary | ICD-10-CM | POA: Diagnosis not present

## 2019-08-28 ENCOUNTER — Other Ambulatory Visit: Payer: Self-pay | Admitting: Physician Assistant

## 2019-08-28 DIAGNOSIS — J42 Unspecified chronic bronchitis: Secondary | ICD-10-CM

## 2019-08-31 ENCOUNTER — Other Ambulatory Visit: Payer: Self-pay | Admitting: Physician Assistant

## 2019-08-31 DIAGNOSIS — F339 Major depressive disorder, recurrent, unspecified: Secondary | ICD-10-CM

## 2019-09-26 ENCOUNTER — Other Ambulatory Visit: Payer: Self-pay | Admitting: Physician Assistant

## 2019-09-26 DIAGNOSIS — E7800 Pure hypercholesterolemia, unspecified: Secondary | ICD-10-CM

## 2019-10-03 ENCOUNTER — Other Ambulatory Visit: Payer: Self-pay | Admitting: Physician Assistant

## 2019-10-03 DIAGNOSIS — I10 Essential (primary) hypertension: Secondary | ICD-10-CM

## 2019-10-10 ENCOUNTER — Ambulatory Visit (INDEPENDENT_AMBULATORY_CARE_PROVIDER_SITE_OTHER): Payer: BC Managed Care – PPO | Admitting: Physician Assistant

## 2019-10-10 ENCOUNTER — Encounter: Payer: Self-pay | Admitting: Physician Assistant

## 2019-10-10 ENCOUNTER — Other Ambulatory Visit: Payer: Self-pay

## 2019-10-10 VITALS — Ht 67.5 in | Wt 231.0 lb

## 2019-10-10 DIAGNOSIS — F17200 Nicotine dependence, unspecified, uncomplicated: Secondary | ICD-10-CM

## 2019-10-10 DIAGNOSIS — J441 Chronic obstructive pulmonary disease with (acute) exacerbation: Secondary | ICD-10-CM | POA: Diagnosis not present

## 2019-10-10 DIAGNOSIS — R42 Dizziness and giddiness: Secondary | ICD-10-CM

## 2019-10-10 DIAGNOSIS — I951 Orthostatic hypotension: Secondary | ICD-10-CM

## 2019-10-10 DIAGNOSIS — R5383 Other fatigue: Secondary | ICD-10-CM

## 2019-10-10 DIAGNOSIS — R7303 Prediabetes: Secondary | ICD-10-CM

## 2019-10-10 DIAGNOSIS — J411 Mucopurulent chronic bronchitis: Secondary | ICD-10-CM

## 2019-10-10 DIAGNOSIS — R0602 Shortness of breath: Secondary | ICD-10-CM

## 2019-10-10 MED ORDER — DOXYCYCLINE HYCLATE 100 MG PO TABS: 100.0000 mg | ORAL_TABLET | Freq: Two times a day (BID) | ORAL | 0 refills | Status: DC

## 2019-10-10 MED ORDER — PREDNISONE 20 MG PO TABS: ORAL_TABLET | ORAL | 0 refills | Status: DC

## 2019-10-10 NOTE — Patient Instructions (Signed)

## 2019-10-10 NOTE — Progress Notes (Signed)
Subjective:    Patient ID: Ashley Scott, female    DOB: 1955-06-03, 64 y.o.   MRN: [id number]  HPI  Patient is a 64 year old obese female with COPD and current smoker who presents to the clinic with ongoing fatigue, weakness, dizziness.  She absolutely has no energy.  She is sleeping 12 to 14 hours a day.  Early in the spring she had Covid and then she was later diagnosed with mono.  Her breathing is terrible.  She is wheezing and very short of breath.  She does not like to use her rescue inhaler but occasionally will.  She has a productive cough.  She is using Trelegy and she has noticed an improvement with using Trelegy.  She denies any fever, chills, body aches.  Patient denies any swelling of extremities.  She does have ongoing right-sided headache.  She continues to smoke.  She has tried stopping but always returns back to smoking.   .. Active Ambulatory Problems    Diagnosis Date Noted   COPD (chronic obstructive pulmonary disease) ([place]) 10/03/2017   Hypercholesteremia 10/03/2017   Adenomatous polyp 10/03/2017   Family history of heart failure 10/04/2017   SOB (shortness of breath) on exertion 10/04/2017   Current smoker 10/04/2017   Bilateral impacted cerumen 10/04/2017   Vitamin D insufficiency 10/04/2017   Essential hypertension 10/04/2017   Depression, recurrent ([place]) 10/04/2017   Seasonal allergies 10/04/2017   Obesity (BMI 30-39.9) 10/04/2017   Bilateral lower extremity edema 10/08/2017   Orthopnea 10/08/2017   Left leg pain 10/08/2017   Tricuspid regurgitation 10/16/2017   Class 2 obesity due to excess calories without serious comorbidity with body mass index (BMI) of 35.0 to 35.9 in adult 11/02/2017   OAB (overactive bladder) 11/02/2017   Elevated fasting glucose 02/04/2018   Fracture of radius, left, closed 03/15/2018   Wound of right leg 04/19/2018   Staphylococcal infection of skin 08/06/2018   Pre-diabetes 08/06/2018   Chronic fatigue  06/13/2019   Cystitis 06/13/2019   Mononucleosis 06/17/2019   Orthostatic hypotension 10/13/2019   No energy 10/13/2019   Dizziness 10/13/2019   Mucopurulent chronic bronchitis ([place]) 10/13/2019   Resolved Ambulatory Problems    Diagnosis Date Noted   No Resolved Ambulatory Problems   Past Medical History:  Diagnosis Date   Anxiety    Asthma    Herpes    Hypertension    Seizure-like activity ([place])        Review of Systems See HPI.    Objective:   Physical Exam Vitals reviewed.  Constitutional:      Appearance: Normal appearance. She is obese. She is ill-appearing.  HENT:     Head: Normocephalic.     Right Ear: Tympanic membrane, ear canal and external ear normal.     Left Ear: Tympanic membrane, ear canal and external ear normal.     Nose: Nose normal.     Mouth/Throat:     Mouth: Mucous membranes are moist.  Eyes:     Conjunctiva/sclera: Conjunctivae normal.  Neck:     Vascular: No carotid bruit.  Cardiovascular:     Rate and Rhythm: Normal rate and regular rhythm.     Pulses: Normal pulses.  Pulmonary:     Comments: Bilateral lung wheezing, rhonchi and coarse breath sounds.  Wheezing with breathing.  Abdominal:     General: There is no distension.     Palpations: Abdomen is soft.     Tenderness: There is no abdominal tenderness.  Musculoskeletal:  Right lower leg: No edema.     Left lower leg: No edema.  Skin:    General: Skin is warm.  Neurological:     General: No focal deficit present.     Mental Status: She is alert and oriented to person, place, and time.     Comments: dix-hallpike negative.   Psychiatric:        Mood and Affect: Mood normal.           Assessment & Plan:  Marland KitchenMarland KitchenOliana was seen today for fatigue and dizziness.  Diagnoses and all orders for this visit:  COPD exacerbation ([place]) -     CBC with Differential/Platelet -     COMPLETE METABOLIC PANEL WITH GFR -     predniSONE (DELTASONE) 20 MG tablet; Take 3  tablets for 3 days, take 2 tablets for 3 days, take 1 tablet for 3 days, take 1/2 tablet for 4 days. -     doxycycline (VIBRA-TABS) 100 MG tablet; Take 1 tablet (100 mg total) by mouth 2 (two) times daily. -     CT Chest Wo Contrast  No energy -     CBC with Differential/Platelet -     COMPLETE METABOLIC PANEL WITH GFR -     TSH -     Hemoglobin A1c  Pre-diabetes -     Hemoglobin A1c  Orthostatic hypotension  Dizziness -     AMBULATORY NON FORMULARY MEDICATION; Overnight pulse oximetry testing.  SOB (shortness of breath) -     AMBULATORY NON FORMULARY MEDICATION; Overnight pulse oximetry testing. -     CT Chest Wo Contrast  Mucopurulent chronic bronchitis (HCC) -     CT Chest Wo Contrast  Current smoker -     CT Chest Wo Contrast   Patient has a fairly recent history of COVID-19 and mono.  Certainly this could cause some ongoing fatigue however I have a strong suspicion that most of her fatigue is coming from her hypoxic state. She is in an exacerbation today. Treated with prednisone/doxy/albuterol regularly. Continue trelegy. Laid patient back to do dix hallpike and lots of SOB and wheezing. Pulse ox overnight testing ordered. Needs CT of chest.  Labs ordered for recheck.  Pt was orthostatic with BP checks.  Discussed hydration and increase in salt.   Follow up in 2 weeks for spironmetry.

## 2019-10-13 DIAGNOSIS — R5383 Other fatigue: Secondary | ICD-10-CM | POA: Insufficient documentation

## 2019-10-13 DIAGNOSIS — R42 Dizziness and giddiness: Secondary | ICD-10-CM | POA: Insufficient documentation

## 2019-10-13 DIAGNOSIS — J439 Emphysema, unspecified: Secondary | ICD-10-CM | POA: Insufficient documentation

## 2019-10-13 DIAGNOSIS — I951 Orthostatic hypotension: Secondary | ICD-10-CM | POA: Insufficient documentation

## 2019-10-13 MED ORDER — AMBULATORY NON FORMULARY MEDICATION: 0 refills | Status: DC

## 2019-10-15 ENCOUNTER — Telehealth: Payer: Self-pay | Admitting: Neurology

## 2019-10-15 ENCOUNTER — Other Ambulatory Visit: Payer: Self-pay | Admitting: Physician Assistant

## 2019-10-15 ENCOUNTER — Ambulatory Visit (INDEPENDENT_AMBULATORY_CARE_PROVIDER_SITE_OTHER): Payer: BC Managed Care – PPO

## 2019-10-15 ENCOUNTER — Other Ambulatory Visit: Payer: Self-pay

## 2019-10-15 DIAGNOSIS — J411 Mucopurulent chronic bronchitis: Secondary | ICD-10-CM | POA: Diagnosis not present

## 2019-10-15 DIAGNOSIS — J441 Chronic obstructive pulmonary disease with (acute) exacerbation: Secondary | ICD-10-CM

## 2019-10-15 DIAGNOSIS — Z1231 Encounter for screening mammogram for malignant neoplasm of breast: Secondary | ICD-10-CM

## 2019-10-15 DIAGNOSIS — R0602 Shortness of breath: Secondary | ICD-10-CM

## 2019-10-15 DIAGNOSIS — F17200 Nicotine dependence, unspecified, uncomplicated: Secondary | ICD-10-CM

## 2019-10-15 NOTE — Addendum Note (Signed)
Addended byAnnamaria Helling on: 10/15/2019 09:34 AM   Modules accepted: Orders

## 2019-10-15 NOTE — Telephone Encounter (Signed)
Charmian Muff sent to Annamaria Helling, CMA We should be good to use the current order. I'll talk to the girl here who processes the ONOs and have her follow up in two weeks.       Previous Messages   ----- Message -----  From: Annamaria Helling, CMA  Sent: 10/15/2019  9:31 AM EDT  To: Charmian Muff  Subject: RE: Overnight pulse oximetry order        Sorry, she placed under a medication order. I reordered.  Can we send for her to do after her 13 day taper or do we need to reorder after?  ----- Message -----  From: Charmian Muff  Sent: 10/15/2019  9:09 AM EDT  To: Annamaria Helling, CMA  Subject: FW: Overnight pulse oximetry order        No orders in pt's chart for ONO. Also, pt was placed on prednisone taper. Any testing done while on prednisone will not be valid.

## 2019-10-15 NOTE — Telephone Encounter (Signed)
Charmian Muff sent to Annamaria Helling, CMA Thank you, will process.   Janett Billow       Previous Messages   ----- Message -----  From: Annamaria Helling, CMA  Sent: 10/14/2019  9:03 AM EDT  To: Mady Gemma  Subject: Overnight pulse oximetry order          Order in patient's chart for overnight pulse oximetry testing.  Can we get this sent to her? Thanks!   Caramia Boutin    [facility] received order for overnight pulse oximetry testing and will process.

## 2019-10-15 NOTE — Progress Notes (Signed)
No acute findings but you do have Centrilobular emphysema.  This is overall good news. Now we just have to stop smoking and target the COPD inflammation.

## 2019-10-22 ENCOUNTER — Ambulatory Visit: Payer: BC Managed Care – PPO

## 2019-11-21 ENCOUNTER — Telehealth (INDEPENDENT_AMBULATORY_CARE_PROVIDER_SITE_OTHER): Payer: BC Managed Care – PPO | Admitting: Physician Assistant

## 2019-11-21 ENCOUNTER — Encounter: Payer: Self-pay | Admitting: Physician Assistant

## 2019-11-21 VITALS — BP 128/71 | HR 79 | Temp 98.2°F

## 2019-11-21 DIAGNOSIS — J441 Chronic obstructive pulmonary disease with (acute) exacerbation: Secondary | ICD-10-CM | POA: Diagnosis not present

## 2019-11-21 DIAGNOSIS — J432 Centrilobular emphysema: Secondary | ICD-10-CM | POA: Diagnosis not present

## 2019-11-21 DIAGNOSIS — I10 Essential (primary) hypertension: Secondary | ICD-10-CM | POA: Diagnosis not present

## 2019-11-21 MED ORDER — PREDNISONE 20 MG PO TABS: ORAL_TABLET | ORAL | 0 refills | Status: DC

## 2019-11-21 MED ORDER — LISINOPRIL-HYDROCHLOROTHIAZIDE 20-12.5 MG PO TABS: 1.0000 | ORAL_TABLET | Freq: Every day | ORAL | 1 refills | Status: DC

## 2019-11-21 NOTE — Progress Notes (Signed)
Patient ID: Ashley Scott, female   DOB: Dec 10, 1955, 64 y.o.   MRN: [id number] .Marland KitchenVirtual Visit via Telephone Note  I connected with Ashley Scott on 11/21/2019 at 11:30 AM EDT by telephone and verified that I am speaking with the correct person using two identifiers.  Location: Patient: home Provider: clinic   I discussed the limitations, risks, security and privacy concerns of performing an evaluation and management service by telephone and the availability of in person appointments. I also discussed with the patient that there may be a patient responsible charge related to this service. The patient expressed understanding and agreed to proceed.   History of Present Illness: Pt is a 64 yo obese female with emphysema, HTN who presents to the clinic to discuss breathing. Her breathing is getting worse and worse. At this point she feels like "she has to do something".her activity is severely limited. She feels like she can not walk for very long at all. Cough is productive.  Pulse ox is 88-93 percent most days. She did quit smoking 2 days ago.  She is on trelegy and using albuterol inhaler every day multiple times. This does help but nothing like being on prednisone. She does not have nebulizer. She is supposed to get her O2 overnight oximetry to see if needs O2 at night. She has recently been treated with antibiotic and prednisone about 1 month ago. She got a little better then worse again.   .. Active Ambulatory Problems    Diagnosis Date Noted  . COPD (chronic obstructive pulmonary disease) ([place]) 10/03/2017  . Hypercholesteremia 10/03/2017  . Adenomatous polyp 10/03/2017  . Family history of heart failure 10/04/2017  . SOB (shortness of breath) on exertion 10/04/2017  . Current smoker 10/04/2017  . Bilateral impacted cerumen 10/04/2017  . Vitamin D insufficiency 10/04/2017  . Essential hypertension 10/04/2017  . Depression, recurrent ([place]) 10/04/2017  . Seasonal allergies 10/04/2017  .  Obesity (BMI 30-39.9) 10/04/2017  . Bilateral lower extremity edema 10/08/2017  . Orthopnea 10/08/2017  . Left leg pain 10/08/2017  . Tricuspid regurgitation 10/16/2017  . Class 2 obesity due to excess calories without serious comorbidity with body mass index (BMI) of 35.0 to 35.9 in adult 11/02/2017  . OAB (overactive bladder) 11/02/2017  . Elevated fasting glucose 02/04/2018  . Wound of right leg 04/19/2018  . Pre-diabetes 08/06/2018  . Chronic fatigue 06/13/2019  . Cystitis 06/13/2019  . Mononucleosis 06/17/2019  . Orthostatic hypotension 10/13/2019  . No energy 10/13/2019  . Dizziness 10/13/2019  . Emphysema lung ([place]) 10/13/2019   Resolved Ambulatory Problems    Diagnosis Date Noted  . Fracture of radius, left, closed 03/15/2018  . Staphylococcal infection of skin 08/06/2018   Past Medical History:  Diagnosis Date  . Anxiety   . Asthma   . Herpes   . Hypertension   . Seizure-like activity ([place])    Reviewed med, allergy, problem list.    Observations/Objective: No acute distress Productive cough. Some labored breathing in conversation.   .. Today's Vitals   11/21/19 1128  BP: 128/71  Pulse: 79  Temp: 98.2 F (36.8 C)  SpO2: 93%   There is no height or weight on file to calculate BMI.    Assessment and Plan: Marland KitchenMarland KitchenDiagnoses and all orders for this visit:  Centrilobular emphysema ([place]) -     For home use only DME Nebulizer machine -     Ambulatory referral to Pulmonology -     ipratropium-albuterol (DUONEB) 0.5-2.5 (3) MG/3ML SOLN; Take 3  mLs by nebulization every 4 (four) hours as needed.  Essential hypertension -     lisinopril-hydrochlorothiazide (ZESTORETIC) 20-12.5 MG tablet; Take 1 tablet by mouth daily.  COPD exacerbation (HCC) -     predniSONE (DELTASONE) 20 MG tablet; Take three tablets for 3 days, take two tablets for 3 days, take one tablets for 3 days, take 1/2 tablet for 4 days. -     ipratropium-albuterol (DUONEB) 0.5-2.5 (3) MG/3ML SOLN;  Take 3 mLs by nebulization every 4 (four) hours as needed.   Likely pt needs O2 continous. Once she gets back to baseline consider walk test to qualify. Will see what overnight O2 shows as well. Treated with prednisone for COPD exacerbation. Needs nebulizer ordered and use every 4 to 6 hours for SOB/cough. Continue trelegy. Great job on smoking cessation. CT done 1 month ago-no worrisome findings. Pt is very tired but I think some of it could be coming from labored breathing.   BP looks great. Refilled medications.   Follow up in about 3 weeks for walk test. Pulmonology referral made.    Follow Up Instructions:    I discussed the assessment and treatment plan with the patient. The patient was provided an opportunity to ask questions and all were answered. The patient agreed with the plan and demonstrated an understanding of the instructions.   The patient was advised to call back or seek an in-person evaluation if the symptoms worsen or if the condition fails to improve as anticipated.  I provided 10 minutes of non-face-to-face time during this encounter.   Iran Planas, PA-C

## 2019-11-23 MED ORDER — IPRATROPIUM-ALBUTEROL 0.5-2.5 (3) MG/3ML IN SOLN: 3.0000 mL | RESPIRATORY_TRACT | 0 refills | Status: DC | PRN

## 2019-11-24 ENCOUNTER — Encounter: Payer: Self-pay | Admitting: Physician Assistant

## 2019-11-24 DIAGNOSIS — R0683 Snoring: Secondary | ICD-10-CM | POA: Diagnosis not present

## 2019-11-24 DIAGNOSIS — G4730 Sleep apnea, unspecified: Secondary | ICD-10-CM | POA: Diagnosis not present

## 2019-11-25 ENCOUNTER — Other Ambulatory Visit: Payer: Self-pay | Admitting: Nurse Practitioner

## 2019-11-25 DIAGNOSIS — F339 Major depressive disorder, recurrent, unspecified: Secondary | ICD-10-CM

## 2019-12-01 ENCOUNTER — Other Ambulatory Visit: Payer: Self-pay | Admitting: Neurology

## 2019-12-01 DIAGNOSIS — J441 Chronic obstructive pulmonary disease with (acute) exacerbation: Secondary | ICD-10-CM

## 2019-12-01 DIAGNOSIS — R0602 Shortness of breath: Secondary | ICD-10-CM

## 2019-12-03 ENCOUNTER — Telehealth: Payer: Self-pay | Admitting: Neurology

## 2019-12-03 DIAGNOSIS — G4734 Idiopathic sleep related nonobstructive alveolar hypoventilation: Secondary | ICD-10-CM

## 2019-12-03 NOTE — Telephone Encounter (Signed)
Overnight oximetry testing received and per [facility] patient needs overnight O2. Order entered. Message sent to [facility] to contact patient.   Patient made aware, they will contact her directly.

## 2019-12-19 ENCOUNTER — Other Ambulatory Visit: Payer: Self-pay | Admitting: Physician Assistant

## 2019-12-19 DIAGNOSIS — J441 Chronic obstructive pulmonary disease with (acute) exacerbation: Secondary | ICD-10-CM

## 2019-12-19 DIAGNOSIS — J432 Centrilobular emphysema: Secondary | ICD-10-CM

## 2019-12-27 ENCOUNTER — Other Ambulatory Visit: Payer: Self-pay | Admitting: Physician Assistant

## 2019-12-27 DIAGNOSIS — E7800 Pure hypercholesterolemia, unspecified: Secondary | ICD-10-CM

## 2020-01-27 ENCOUNTER — Other Ambulatory Visit: Payer: Self-pay | Admitting: Physician Assistant

## 2020-01-27 DIAGNOSIS — E7800 Pure hypercholesterolemia, unspecified: Secondary | ICD-10-CM

## 2020-02-10 ENCOUNTER — Other Ambulatory Visit: Payer: Self-pay | Admitting: Physician Assistant

## 2020-02-10 DIAGNOSIS — E7800 Pure hypercholesterolemia, unspecified: Secondary | ICD-10-CM

## 2020-02-17 ENCOUNTER — Other Ambulatory Visit: Payer: Self-pay | Admitting: Physician Assistant

## 2020-02-17 DIAGNOSIS — F339 Major depressive disorder, recurrent, unspecified: Secondary | ICD-10-CM

## 2020-05-10 ENCOUNTER — Other Ambulatory Visit: Payer: Self-pay

## 2020-05-10 ENCOUNTER — Ambulatory Visit (INDEPENDENT_AMBULATORY_CARE_PROVIDER_SITE_OTHER): Payer: BC Managed Care – PPO | Admitting: Physician Assistant

## 2020-05-10 VITALS — BP 127/76 | HR 80 | Ht 67.5 in | Wt 224.0 lb

## 2020-05-10 DIAGNOSIS — F339 Major depressive disorder, recurrent, unspecified: Secondary | ICD-10-CM

## 2020-05-10 DIAGNOSIS — R252 Cramp and spasm: Secondary | ICD-10-CM

## 2020-05-10 DIAGNOSIS — R5382 Chronic fatigue, unspecified: Secondary | ICD-10-CM

## 2020-05-10 DIAGNOSIS — Z1231 Encounter for screening mammogram for malignant neoplasm of breast: Secondary | ICD-10-CM

## 2020-05-10 DIAGNOSIS — I10 Essential (primary) hypertension: Secondary | ICD-10-CM

## 2020-05-10 DIAGNOSIS — G478 Other sleep disorders: Secondary | ICD-10-CM

## 2020-05-10 DIAGNOSIS — R0683 Snoring: Secondary | ICD-10-CM

## 2020-05-10 DIAGNOSIS — R7303 Prediabetes: Secondary | ICD-10-CM

## 2020-05-10 DIAGNOSIS — J42 Unspecified chronic bronchitis: Secondary | ICD-10-CM

## 2020-05-10 DIAGNOSIS — F17200 Nicotine dependence, unspecified, uncomplicated: Secondary | ICD-10-CM

## 2020-05-10 DIAGNOSIS — G4700 Insomnia, unspecified: Secondary | ICD-10-CM

## 2020-05-10 DIAGNOSIS — E7800 Pure hypercholesterolemia, unspecified: Secondary | ICD-10-CM

## 2020-05-10 DIAGNOSIS — R5383 Other fatigue: Secondary | ICD-10-CM

## 2020-05-10 DIAGNOSIS — Z1211 Encounter for screening for malignant neoplasm of colon: Secondary | ICD-10-CM

## 2020-05-10 DIAGNOSIS — Z79899 Other long term (current) drug therapy: Secondary | ICD-10-CM

## 2020-05-10 DIAGNOSIS — E669 Obesity, unspecified: Secondary | ICD-10-CM

## 2020-05-10 MED ORDER — TRAZODONE HCL 50 MG PO TABS
25.0000 mg | ORAL_TABLET | Freq: Every evening | ORAL | 3 refills | Status: DC | PRN
Start: 2020-05-10 — End: 2020-07-05

## 2020-05-10 MED ORDER — SERTRALINE HCL 100 MG PO TABS: 100.0000 mg | ORAL_TABLET | Freq: Every day | ORAL | 3 refills | Status: DC

## 2020-05-10 MED ORDER — TRELEGY ELLIPTA 100-62.5-25 MCG/INH IN AEPB: INHALATION_SPRAY | RESPIRATORY_TRACT | 3 refills | Status: DC

## 2020-05-10 MED ORDER — ROSUVASTATIN CALCIUM 10 MG PO TABS: 10.0000 mg | ORAL_TABLET | Freq: Every day | ORAL | 3 refills | Status: DC

## 2020-05-10 MED ORDER — LISINOPRIL-HYDROCHLOROTHIAZIDE 20-12.5 MG PO TABS: 1.0000 | ORAL_TABLET | Freq: Every day | ORAL | 1 refills | Status: DC

## 2020-05-10 NOTE — Patient Instructions (Signed)
[facility] [phone]

## 2020-05-10 NOTE — Progress Notes (Signed)
Subjective:    Patient ID: Ashley Scott, female    DOB: 09-30-1955, 65 y.o.   MRN: [id number]  HPI  Pt is a 65 yo obese female with emphysema, HTN, MDD, pre-diabetes who presents to the clinic for medication refills and follow up.   She continues to have a lot of SOB with any exertion. She does not check her pulse ox at home. She continues to smoke. Last CT 2021 no acute findings and last Echo 2019 no acute findings.   No CP, palpitations, headaches or vision changes.   Her mood is ok. She feels down more than she would like but she feels like it is her lack of energy. She is not sleeping well. She snores. She has trouble going and staying asleep and then sleeps all throughout the day. She has lot of leg cramping and wonders if it could be the zocor. Stopped when she was out of it for a while. n   .. Active Ambulatory Problems    Diagnosis Date Noted  . COPD (chronic obstructive pulmonary disease) ([place]) 10/03/2017  . Hypercholesteremia 10/03/2017  . Adenomatous polyp 10/03/2017  . Family history of heart failure 10/04/2017  . SOB (shortness of breath) on exertion 10/04/2017  . Current smoker 10/04/2017  . Bilateral impacted cerumen 10/04/2017  . Vitamin D insufficiency 10/04/2017  . Essential hypertension 10/04/2017  . Depression, recurrent ([place]) 10/04/2017  . Seasonal allergies 10/04/2017  . Obesity (BMI 30-39.9) 10/04/2017  . Bilateral lower extremity edema 10/08/2017  . Orthopnea 10/08/2017  . Left leg pain 10/08/2017  . Tricuspid regurgitation 10/16/2017  . Class 2 obesity due to excess calories without serious comorbidity with body mass index (BMI) of 35.0 to 35.9 in adult 11/02/2017  . OAB (overactive bladder) 11/02/2017  . Elevated fasting glucose 02/04/2018  . Pre-diabetes 08/06/2018  . Chronic fatigue 06/13/2019  . Cystitis 06/13/2019  . Mononucleosis 06/17/2019  . Orthostatic hypotension 10/13/2019  . No energy 10/13/2019  . Dizziness 10/13/2019  . Emphysema  lung ([place]) 10/13/2019   Resolved Ambulatory Problems    Diagnosis Date Noted  . Fracture of radius, left, closed 03/15/2018  . Wound of right leg 04/19/2018  . Staphylococcal infection of skin 08/06/2018   Past Medical History:  Diagnosis Date  . Anxiety   . Asthma   . Herpes   . Hypertension   . Seizure-like activity ([place])    Review of Systems See HPI.     Objective:   Physical Exam Vitals reviewed.  Constitutional:      Appearance: Normal appearance. She is obese.  HENT:     Mouth/Throat:     Mouth: Mucous membranes are moist.  Eyes:     Extraocular Movements: Extraocular movements intact.     Conjunctiva/sclera: Conjunctivae normal.     Pupils: Pupils are equal, round, and reactive to light.  Neck:     Vascular: No carotid bruit.  Cardiovascular:     Rate and Rhythm: Normal rate and regular rhythm.     Pulses: Normal pulses.     Heart sounds: Murmur heard.    Pulmonary:     Effort: Pulmonary effort is normal.     Breath sounds: Wheezing and rhonchi present.  Musculoskeletal:     Right lower leg: No edema.     Left lower leg: No edema.  Neurological:     General: No focal deficit present.     Mental Status: She is alert.  Psychiatric:        Mood  and Affect: Mood normal.       .. Depression screen [facility] 2/9 05/10/2020 06/13/2019 11/27/2018 08/05/2018 10/08/2017  Decreased Interest 1 0 2 0 2  Down, Depressed, Hopeless 0 2 2 0 2  PHQ - 2 Score 1 2 4  0 4  Altered sleeping 3 2 3 3 3   Tired, decreased energy 3 3 3 3 3   Change in appetite 2 3 3 1 1   Feeling bad or failure about yourself  0 0 1 1 2   Trouble concentrating 3 3 0 2 1  Moving slowly or fidgety/restless 1 0 0 0 0  Suicidal thoughts 0 0 0 0 0  PHQ-9 Score 13 13 14 10 14   Difficult doing work/chores Somewhat difficult Somewhat difficult Somewhat difficult Not difficult at all Somewhat difficult   .Marland Kitchen GAD 7 : Generalized Anxiety Score 05/10/2020 06/13/2019 11/27/2018 08/05/2018  Nervous, Anxious, on Edge 2 3  0 2  Control/stop worrying 1 3 2 3   Worry too much - different things 1 3 2 3   Trouble relaxing 2 3 2 3   Restless 0 0 0 0  Easily annoyed or irritable 2 1 0 1  Afraid - awful might happen 0 0 0 0  Total GAD 7 Score 8 13 6 12   Anxiety Difficulty Somewhat difficult Not difficult at all Somewhat difficult Not difficult at all        Assessment & Plan:  Marland KitchenMarland KitchenMitsuko was seen today for follow-up.  Diagnoses and all orders for this visit:  Pre-diabetes -     Hemoglobin A1c -     rosuvastatin (CRESTOR) 10 MG tablet; Take 1 tablet (10 mg total) by mouth daily.  Chronic bronchitis, unspecified chronic bronchitis type ([place]) -     Fluticasone-Umeclidin-Vilant (TRELEGY ELLIPTA) 100-62.5-25 MCG/INH AEPB; TAKE 1 PUFF BY MOUTH EVERY DAY -     CBC with Differential/Platelet  Depression, recurrent (HCC) -     sertraline (ZOLOFT) 100 MG tablet; Take 1 tablet (100 mg total) by mouth daily.  Essential hypertension -     lisinopril-hydrochlorothiazide (ZESTORETIC) 20-12.5 MG tablet; Take 1 tablet by mouth daily. -     COMPLETE METABOLIC PANEL WITH GFR  Hypercholesteremia -     Lipid Panel w/reflex Direct LDL -     rosuvastatin (CRESTOR) 10 MG tablet; Take 1 tablet (10 mg total) by mouth daily.  Chronic fatigue -     TSH  Medication management -     CBC with Differential/Platelet  Encounter for screening mammogram for malignant neoplasm of breast -     MM 3D SCREEN BREAST BILATERAL  Colon cancer screening -     Ambulatory referral to Gastroenterology  Leg cramps -     Magnesium  Obesity (BMI 30-39.9) -     Split night study  Snoring -     Split night study  Non-restorative sleep -     Split night study  Fatigue, unspecified type -     Split night study  Current smoker  Insomnia, unspecified type -     traZODone (DESYREL) 50 MG tablet; Take 0.5-1 tablets (25-50 mg total) by mouth at bedtime as needed for sleep.  pt is due to enroll in medicare in next month and would like to  wait on health maintance. Hx of colon polyp. Pt needs 5 year follow up placed order today.   COPD- stable pulse ox. I do wonder if she is dropping at night. Will get sleep study. Continue trelegy and duo neb as needed.  Discussed smoking cessation. She had CT last year but would qualify for yearly screening. Waiting for medicare.   She is really fatigued. Will get sleep study and labs. Certainly COPD is not helping her energy. She is having trouble falling asleep. Discussed natural ways. Added trazodone to help with sleep.     Leg cramping could be from zocor. Stop. Try crestor. Ok to take every other day as well to see if it helps with any side effects. Discussed importance of taking statin to decrease CV risk.

## 2020-05-11 ENCOUNTER — Encounter: Payer: Self-pay | Admitting: Physician Assistant

## 2020-06-03 ENCOUNTER — Other Ambulatory Visit: Payer: Self-pay | Admitting: Physician Assistant

## 2020-06-03 DIAGNOSIS — G4700 Insomnia, unspecified: Secondary | ICD-10-CM

## 2020-06-28 ENCOUNTER — Other Ambulatory Visit: Payer: Self-pay | Admitting: Physician Assistant

## 2020-06-28 DIAGNOSIS — J42 Unspecified chronic bronchitis: Secondary | ICD-10-CM

## 2020-06-30 ENCOUNTER — Other Ambulatory Visit: Payer: Self-pay

## 2020-06-30 ENCOUNTER — Ambulatory Visit: Payer: BC Managed Care – PPO

## 2020-06-30 ENCOUNTER — Other Ambulatory Visit: Payer: Self-pay | Admitting: Physician Assistant

## 2020-06-30 DIAGNOSIS — N6320 Unspecified lump in the left breast, unspecified quadrant: Secondary | ICD-10-CM

## 2020-06-30 NOTE — Progress Notes (Signed)
Pt reported left arm mass felt when went to screening mammo. She now needs diagnostic but will only go in [place]. Can we get this ordered so goes to [place] [facility]? Please.

## 2020-07-01 NOTE — Progress Notes (Signed)
Do you also want an ultra sound?

## 2020-07-02 NOTE — Progress Notes (Signed)
Yes please [place] left breast limited.

## 2020-07-05 ENCOUNTER — Other Ambulatory Visit: Payer: Self-pay | Admitting: Physician Assistant

## 2020-07-05 DIAGNOSIS — G4700 Insomnia, unspecified: Secondary | ICD-10-CM

## 2020-07-05 NOTE — Progress Notes (Signed)
Orders placed.

## 2020-07-27 DIAGNOSIS — R922 Inconclusive mammogram: Secondary | ICD-10-CM | POA: Diagnosis not present

## 2020-07-27 DIAGNOSIS — N6320 Unspecified lump in the left breast, unspecified quadrant: Secondary | ICD-10-CM | POA: Diagnosis not present

## 2020-07-27 DIAGNOSIS — N6332 Unspecified lump in axillary tail of the left breast: Secondary | ICD-10-CM | POA: Diagnosis not present

## 2020-07-27 LAB — HM MAMMOGRAPHY

## 2020-07-30 ENCOUNTER — Other Ambulatory Visit: Payer: Self-pay | Admitting: Physician Assistant

## 2020-07-30 DIAGNOSIS — N6320 Unspecified lump in the left breast, unspecified quadrant: Secondary | ICD-10-CM

## 2020-09-07 ENCOUNTER — Encounter: Payer: Self-pay | Admitting: Physician Assistant

## 2020-09-27 ENCOUNTER — Other Ambulatory Visit: Payer: Self-pay | Admitting: Physician Assistant

## 2020-09-27 DIAGNOSIS — G4700 Insomnia, unspecified: Secondary | ICD-10-CM

## 2020-11-08 ENCOUNTER — Encounter: Payer: Self-pay | Admitting: Physician Assistant

## 2020-11-08 ENCOUNTER — Ambulatory Visit (INDEPENDENT_AMBULATORY_CARE_PROVIDER_SITE_OTHER): Payer: Medicare HMO | Admitting: Physician Assistant

## 2020-11-08 VITALS — BP 127/72 | HR 73 | Ht 67.5 in | Wt 226.0 lb

## 2020-11-08 DIAGNOSIS — Z23 Encounter for immunization: Secondary | ICD-10-CM | POA: Diagnosis not present

## 2020-11-08 DIAGNOSIS — J309 Allergic rhinitis, unspecified: Secondary | ICD-10-CM

## 2020-11-08 DIAGNOSIS — Z13820 Encounter for screening for osteoporosis: Secondary | ICD-10-CM

## 2020-11-08 DIAGNOSIS — M720 Palmar fascial fibromatosis [Dupuytren]: Secondary | ICD-10-CM

## 2020-11-08 DIAGNOSIS — K1379 Other lesions of oral mucosa: Secondary | ICD-10-CM

## 2020-11-08 DIAGNOSIS — M25562 Pain in left knee: Secondary | ICD-10-CM

## 2020-11-08 DIAGNOSIS — R7303 Prediabetes: Secondary | ICD-10-CM | POA: Diagnosis not present

## 2020-11-08 DIAGNOSIS — E785 Hyperlipidemia, unspecified: Secondary | ICD-10-CM | POA: Diagnosis not present

## 2020-11-08 DIAGNOSIS — L720 Epidermal cyst: Secondary | ICD-10-CM

## 2020-11-08 DIAGNOSIS — N3281 Overactive bladder: Secondary | ICD-10-CM

## 2020-11-08 DIAGNOSIS — M25511 Pain in right shoulder: Secondary | ICD-10-CM

## 2020-11-08 DIAGNOSIS — J441 Chronic obstructive pulmonary disease with (acute) exacerbation: Secondary | ICD-10-CM

## 2020-11-08 DIAGNOSIS — M65332 Trigger finger, left middle finger: Secondary | ICD-10-CM

## 2020-11-08 DIAGNOSIS — R252 Cramp and spasm: Secondary | ICD-10-CM

## 2020-11-08 DIAGNOSIS — F17200 Nicotine dependence, unspecified, uncomplicated: Secondary | ICD-10-CM

## 2020-11-08 DIAGNOSIS — G8929 Other chronic pain: Secondary | ICD-10-CM | POA: Insufficient documentation

## 2020-11-08 DIAGNOSIS — Z13220 Encounter for screening for lipoid disorders: Secondary | ICD-10-CM

## 2020-11-08 DIAGNOSIS — Z1329 Encounter for screening for other suspected endocrine disorder: Secondary | ICD-10-CM

## 2020-11-08 DIAGNOSIS — Z131 Encounter for screening for diabetes mellitus: Secondary | ICD-10-CM

## 2020-11-08 DIAGNOSIS — Z1211 Encounter for screening for malignant neoplasm of colon: Secondary | ICD-10-CM | POA: Diagnosis not present

## 2020-11-08 MED ORDER — OXYBUTYNIN CHLORIDE ER 5 MG PO TB24: 5.0000 mg | ORAL_TABLET | Freq: Every day | ORAL | 3 refills | Status: DC

## 2020-11-08 MED ORDER — PREDNISONE 50 MG PO TABS: ORAL_TABLET | ORAL | 0 refills | Status: DC

## 2020-11-08 MED ORDER — AMBULATORY NON FORMULARY MEDICATION: 0 refills | Status: DC

## 2020-11-08 MED ORDER — NICOTINE 21 MG/24HR TD PT24: 21.0000 mg | MEDICATED_PATCH | Freq: Every day | TRANSDERMAL | 1 refills | Status: DC

## 2020-11-08 MED ORDER — MONTELUKAST SODIUM 10 MG PO TABS: 10.0000 mg | ORAL_TABLET | Freq: Every day | ORAL | 3 refills | Status: DC

## 2020-11-08 MED ORDER — TRAMADOL HCL 50 MG PO TABS
50.0000 mg | ORAL_TABLET | Freq: Two times a day (BID) | ORAL | 0 refills | Status: AC | PRN
Start: 2020-11-08 — End: 2020-11-13

## 2020-11-08 NOTE — Progress Notes (Signed)
Subjective:    Patient ID: Ashley Scott, female    DOB: February 29, 1956, 65 y.o.   MRN: [id number]  HPI Pt is a 3 obese female with COPD and current smoker, HLD, pre-diabetes who presents to the clinic with multiple concerns.   Pt continues to have SOB and problems breathing. She is on Trelegy and it does help. Using duoneb at least once a day. Continues to smoke at least 1 pack a day. Would like to quit but unsure if she really can.   Pt has a growth on left mid back that bothers her and at times has drained some. Wonders what it is.   She has a painless mass of right buccal area popped up over past 2 months.   Left middle hand hard to open and close. Feel in pulling in palm. Popping and clicking of left middle finger.   Pt having OAB symptoms of urgency, frequency, leaking with stress and urge. Not tried any medication. Just got insurance.   .. Active Ambulatory Problems    Diagnosis Date Noted   COPD (chronic obstructive pulmonary disease) ([place]) 10/03/2017   Hypercholesteremia 10/03/2017   Adenomatous polyp 10/03/2017   Family history of heart failure 10/04/2017   SOB (shortness of breath) on exertion 10/04/2017   Current smoker 10/04/2017   Bilateral impacted cerumen 10/04/2017   Vitamin D insufficiency 10/04/2017   Essential hypertension 10/04/2017   Depression, recurrent ([place]) 10/04/2017   Seasonal allergies 10/04/2017   Obesity (BMI 30-39.9) 10/04/2017   Bilateral lower extremity edema 10/08/2017   Orthopnea 10/08/2017   Left leg pain 10/08/2017   Tricuspid regurgitation 10/16/2017   Class 2 obesity due to excess calories without serious comorbidity with body mass index (BMI) of 35.0 to 35.9 in adult 11/02/2017   OAB (overactive bladder) 11/02/2017   Elevated fasting glucose 02/04/2018   Pre-diabetes 08/06/2018   Chronic fatigue 06/13/2019   Cystitis 06/13/2019   Mononucleosis 06/17/2019   Orthostatic hypotension 10/13/2019   No energy 10/13/2019   Dizziness  10/13/2019   Emphysema lung ([place]) 10/13/2019   Chronic pain of left knee 11/08/2020   Dupuytren's contracture of left hand 11/17/2020   Trigger middle finger of left hand 11/17/2020   Epidermal cyst 11/17/2020   Chronic allergic rhinitis 11/17/2020   COPD exacerbation ([place]) 11/17/2020   Mucocele, 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     Objective:   Physical Exam Vitals reviewed.  Constitutional:      Appearance: Normal appearance. She is obese.  HENT:     Mouth/Throat:     Comments: Right buccal muscosal cyst flesh color and symmetric.  Cardiovascular:     Rate and Rhythm: Normal rate and regular rhythm.  Pulmonary:     Breath sounds: Wheezing present.     Comments: Labored breathing with coarse breath sounds.  Abdominal:     Palpations: Abdomen is soft.  Musculoskeletal:     Right lower leg: No edema.     Left lower leg: No edema.  Skin:    Comments: Left mid back around bra line punctate lesion with surrounding  cyst about 1cm by 1cm. No redness, warmth, discharge, tenderness.   Visible pulled tendon in palm of left hand.  Middle finger with clicking and popping and catching with hand grip of left hand.   Neurological:     General: No focal deficit present.     Mental Status: She is alert and oriented to person, place, and time.  Psychiatric:        Mood and Affect: Mood normal.         .. Depression screen [facility] 2/9 11/08/2020 05/10/2020 06/13/2019 11/27/2018 08/05/2018  Decreased Interest 1 1 0 2 0  Down, Depressed, Hopeless 0 0 2 2 0  PHQ - 2 Score '1 1 2 4 '$ 0  Altered sleeping '3 3 2 3 3  '$ Tired, decreased energy '3 3 3 3 3  '$ Change in appetite '2 2  3 3 1  '$ Feeling bad or failure about yourself  0 0 0 1 1  Trouble concentrating '2 3 3 '$ 0 2  Moving slowly or fidgety/restless 0 1 0 0 0  Suicidal thoughts 0 0 0 0 0  PHQ-9 Score '11 13 13 14 10  '$ Difficult doing work/chores Very difficult Somewhat difficult Somewhat difficult Somewhat difficult Not difficult at all   .Marland Kitchen GAD 7 : Generalized Anxiety Score 11/08/2020 05/10/2020 06/13/2019 11/27/2018  Nervous, Anxious, on Edge '2 2 3 '$ 0  Control/stop worrying '2 1 3 2  '$ Worry too much - different things '2 1 3 2  '$ Trouble relaxing '2 2 3 2  '$ Restless 0 0 0 0  Easily annoyed or irritable '2 2 1 '$ 0  Afraid - awful might happen 0 0 0 0  Total GAD 7 Score '10 8 13 6  '$ Anxiety Difficulty Very difficult Somewhat difficult Not difficult at all Somewhat difficult     Assessment & Plan:  Marland KitchenMarland KitchenIniyah was seen today for follow-up.  Diagnoses and all orders for this visit:  Osteoporosis screening -     DG Bone Density; Future  Colon cancer screening -     Ambulatory referral to Gastroenterology  Need for pneumococcal vaccination -     Pneumococcal conjugate vaccine 20-valent (Prevnar 20)  Acute pain of right shoulder  Chronic pain of left knee -     DG Knee Complete 4 Views Left; Future -     traMADol (ULTRAM) 50 MG tablet; Take 1 tablet (50 mg total) by mouth every 12 (twelve) hours as needed for up to 5 days.  Screening for lipid disorders -     Lipid Panel w/reflex Direct LDL  Screening for diabetes mellitus -     COMPLETE METABOLIC PANEL WITH GFR  Pre-diabetes -     Hemoglobin A1c  Thyroid disorder screen -     TSH -     CBC w/Diff/Platelet  Leg cramps -     Magnesium  Dupuytren's contracture of left hand  Trigger middle finger of left hand  Epidermal cyst  Current smoker -     nicotine (NICODERM CQ) 21 mg/24hr patch; Place 1 patch (21 mg total) onto the skin daily. -     Ambulatory Referral for Lung Cancer Scre  OAB (overactive bladder) -     oxybutynin (DITROPAN-XL) 5 MG 24 hr  tablet; Take 1 tablet (5 mg total) by mouth daily.  Need for shingles vaccine -     AMBULATORY NON FORMULARY MEDICATION; shingrx 2 doses to prevent shingles.  COPD exacerbation (HCC) -     predniSONE (DELTASONE) 50 MG tablet; Take one  tablet daily.  Chronic allergic rhinitis -     montelukast (SINGULAIR) 10 MG tablet; Take 1 tablet (10 mg total) by mouth at bedtime.  Mucocele, buccal  Tobacco dependence -     Ambulatory Referral for Lung Cancer Scre  Needs labs for screening purposes.  Shingrx order printed to have done at pharmacy.  Bone density screening ordered.  Colonoscopy ordered.  Pneumoncoccal 20 given today.   Prednisone burst to stop COPD flare. No signs of bacterial infection.  Added singular to see if helps some with breathing and inflammation.  Patches given to start tapering on smoking cessation. HO given.  Lung cancer screening referral made.   Reassured about epidermoid cyst. Needs another appt for removal or referral to dermatology.   Buccal mucosal cyst. Appears benign but patient does have a long hx of smoking. Will refer to consider biopsy.   OAB start oxybutyin. Discussed side effects.   Left hand trigger finger and possible dupuytrens contracture. See Dr. Darene Lamer.   Left knee pain. Xray ordered. Follow up with Dr. Darene Lamer.

## 2020-11-08 NOTE — Patient Instructions (Addendum)
Epidermoid Cyst  An epidermoid cyst, also known as epidermal cyst, is a sac made of skin tissue. The sac contains a substance called keratin. Keratin is a protein that is normally secreted through the hair follicles. When keratin becomes trapped in the top layer of skin (epidermis), it can form an epidermoid cyst. Epidermoid cysts can be found anywhere on your body. These cysts are usually harmless (benign), and they may not cause symptoms unless they become inflamed or infected. What are the causes? This condition may be caused by: A blocked hair follicle. A hair that curls and re-enters the skin instead of growing straight out of the skin (ingrown hair). A blocked pore. Irritated skin. An injury to the skin. Certain conditions that are passed along from parent to child (inherited). Human papillomavirus (HPV). This happens rarely when cysts occur on the bottom of the feet. Long-term (chronic) sun damage to the skin. What increases the risk? The following factors may make you more likely to develop an epidermoid cyst: Having acne. Being female. Having an injury to the skin. Being past puberty. Having certain rare genetic disorders. What are the signs or symptoms? The only symptom of this condition may be a small, painless lump underneath the skin. When an epidermal cyst ruptures, it may become inflamed. True infection in cysts is rare. Symptoms may include: Redness. Inflammation. Tenderness. Warmth. Keratin draining from the cyst. Keratin is grayish-white, bad-smelling substance. Pus draining from the cyst. How is this diagnosed? This condition is diagnosed with a physical exam. In some cases, you may have a sample of tissue (biopsy) taken from your cyst to be examined under a microscope or tested for bacteria. You may be referred to a health care provider who specializes in skin care (dermatologist). How is this treated? If a cyst becomes inflamed, treatment may include: Opening and  draining the cyst, done by a health care provider. After draining, minor surgery to remove the rest of the cyst may be done. Taking antibiotic medicine. Having injections of medicines (steroids) that help to reduce inflammation. Having surgery to remove the cyst. Surgery may be done if the cyst: Becomes large. Bothers you. Has a chance of turning into cancer. Do not try to open a cyst yourself. Follow these instructions at home: Medicines If you were prescribed an antibiotic medicine, take it it as told by your health care provider. Do not stop using the antibiotic even if you start to feel better. Take over-the-counter and prescription medicines only as told by your health care provider. General instructions Keep the area around your cyst clean and dry. Wear loose, dry clothing. Avoid touching your cyst. Check your cyst every day for signs of infection. Check for: Redness, swelling, or pain. Fluid or blood. Warmth. Pus or a bad smell. Keep all follow-up visits. This is important. How is this prevented? Wear clean, dry, clothing. Avoid wearing tight clothing. Keep your skin clean and dry. Take showers or baths every day. Contact a health care provider if: Your cyst develops symptoms of infection. Your condition is not improving or is getting worse. You develop a cyst that looks different from other cysts you have had. You have a fever. Get help right away if: Redness spreads from the cyst into the surrounding area. Summary An epidermoid cyst is a sac made of skin tissue. These cysts are usually harmless (benign), and they may not cause symptoms unless they become inflamed. If a cyst becomes inflamed, treatment may include surgery to open and drain the   cyst, or to remove it. Treatment may also include medicines by mouth or through an injection. Take over-the-counter and prescription medicines only as told by your health care provider. If you were prescribed an antibiotic medicine,  take it as told by your health care provider. Do not stop using the antibiotic even if you start to feel better. Contact a health care provider if your condition is not improving or is getting worse. Keep all follow-up visits as told by your health care provider. This is important. This information is not intended to replace advice given to you by your health care provider. Make sure you discuss any questions you have with your healthcare provider. Document Revised: 06/25/2019 Document Reviewed: 06/25/2019 Elsevier Patient Education  2022 [place]. Dupuytren's Contracture Dupuytren's contracture is a condition in which tissue under the skin of the palm becomes thick. This causes one or more of the fingers to curl inward (contract) toward the palm. After a while, the fingers may not be able to straighten out. This condition affects some or all of the fingers and the palm of thehand. This condition may affect one or both hands. Dupuytren's contracture is a long-term (chronic) condition that develops (progresses) slowly over time. There is no cure, but symptoms can be managed and progression can be slowed with treatment. This condition is usually notdangerous or painful, but it can interfere with everyday tasks. What are the causes?  This condition is caused by tissue (fascia) in the palm that gets thicker and tighter. When the fascia thickens, it pulls on the cords of tissue (tendons) that control finger movement. This causes the fingers to contract. The cause of fascia thickening is not known. However, the condition is often passed along from parent to child (inherited). What increases the risk? The following factors may make you more likely to develop this condition: Being 74 years of age or older. Being female. Having a family history of this condition. Using tobacco products, including cigarettes, chewing tobacco, and e-cigarettes. Drinking alcohol excessively. Having diabetes. Having a  seizure disorder. What are the signs or symptoms? Early symptoms of this condition may include: Thick, puckered skin on the hand. One or more lumps (nodules) on the palm. Nodules may be tender when they first appear, but they are generally painless. Later symptoms of this condition may include: Thick cords of tissue in the palm. Fingers curled up toward the palm. Inability to straighten the fingers into their normal position. Though this condition is usually painless, you may have discomfort when holdingor grabbing objects. How is this diagnosed? This condition is diagnosed with a physical exam, which may include: Looking at your hands and feeling your palms. This is to check for thickened fascia and nodules. Measuring finger motion. Doing the [place] tabletop test. You may be asked to try to put your hand on a surface, with your palm down and your fingers straight out. How is this treated? There is no cure for this condition, but treatment can relieve discomfort and make symptoms more manageable. Treatment options may include: Physical therapy. This can strengthen your hand and increase flexibility. Occupational therapy. This can help you with everyday tasks that may be more difficult because of your condition. Shots (injections). Substances may be injected into your hand, such as: Medicines that help to decrease swelling (corticosteroids). Proteins (collagenase) to weaken thick tissue. After a collagenase injection, your health care provider may stretch your fingers. Needle aponeurotomy. A needle is pushed through the skin and into the fascia. Moving  the needle against the fascia can weaken or break up the thick tissue. Surgery. This may be needed if your condition causes discomfort or interferes with everyday activities. Physical therapy is usually needed after surgery. No treatment is guaranteed to cure this condition. Recurrence of symptoms iscommon. Follow these instructions at  home: Hand care Take these actions to help protect your hand from possible injury: Use tools that have padded grips. Wear protective gloves while you work with your hands. Avoid repetitive hand movements. General instructions Take over-the-counter and prescription medicines only as told by your health care provider. Manage any other conditions that you have, such as diabetes. If physical therapy was prescribed, do exercises as told by your health care provider. Do not use any products that contain nicotine or tobacco, such as cigarettes, e-cigarettes, and chewing tobacco. If you need help quitting, ask your health care provider. If you drink alcohol: Limit how much you use to: 0-1 drink a day for women. 0-2 drinks a day for men. Be aware of how much alcohol is in your drink. In the [place] one drink equals one 12 oz bottle of beer (355 mL), one 5 oz glass of wine (148 mL), or one 1 oz glass of hard liquor (44 mL). Keep all follow-up visits as told by your health care provider. This is important. Contact a health care provider if: You develop new symptoms, or your symptoms get worse. You have pain that gets worse or does not get better with medicine. You have difficulty or discomfort with everyday tasks. You develop numbness or tingling. Get help right away if: You have severe pain. Your fingers change color or become unusually cold. Summary Dupuytren's contracture is a condition in which tissue under the skin of the palm becomes thick. This condition is caused by tissue (fascia) that thickens. When it thickens, it pulls on the cords of tissue (tendons) that control finger movement and makes the fingers to contract. You are more likely to develop this condition if you are a man, are over 17 years of age, have a family history of the condition, and drink a lot of alcohol. This condition can be treated with physical and occupational therapy, injections, and surgery. Follow instructions  about how to care for your hand. Get help right away if you have severe pain or your fingers change color or become cold. This information is not intended to replace advice given to you by your health care provider. Make sure you discuss any questions you have with your healthcare provider. Document Revised: 10/09/2017 Document Reviewed: 10/09/2017 Elsevier Patient Education  [place].

## 2020-11-09 LAB — COMPLETE METABOLIC PANEL WITH GFR
AG Ratio: 1.7 (calc) (ref 1.0–2.5)
ALT: 10 U/L (ref 6–29)
AST: 14 U/L (ref 10–35)
Albumin: 4.5 g/dL (ref 3.6–5.1)
Alkaline phosphatase (APISO): 71 U/L (ref 37–153)
BUN: 8 mg/dL (ref 7–25)
CO2: 30 mmol/L (ref 20–32)
Calcium: 10.4 mg/dL (ref 8.6–10.4)
Chloride: 99 mmol/L (ref 98–110)
Creat: 0.56 mg/dL (ref 0.50–1.05)
Globulin: 2.7 g/dL (calc) (ref 1.9–3.7)
Glucose, Bld: 89 mg/dL (ref 65–99)
Potassium: 4.2 mmol/L (ref 3.5–5.3)
Sodium: 137 mmol/L (ref 135–146)
Total Bilirubin: 0.7 mg/dL (ref 0.2–1.2)
Total Protein: 7.2 g/dL (ref 6.1–8.1)
eGFR: 101 mL/min/{1.73_m2} (ref >=60)

## 2020-11-09 LAB — CBC WITH DIFFERENTIAL/PLATELET
Absolute Monocytes: 764 cells/uL (ref 200–950)
Basophils Absolute: 54 cells/uL (ref 0–200)
Basophils Relative: 0.4 %
Eosinophils Absolute: 134 cells/uL (ref 15–500)
Eosinophils Relative: 1 %
HCT: 48.3 % — ABNORMAL HIGH (ref 35.0–45.0)
Hemoglobin: 16.6 g/dL — ABNORMAL HIGH (ref 11.7–15.5)
Lymphs Abs: 2519 cells/uL (ref 850–3900)
MCH: 32.2 pg (ref 27.0–33.0)
MCHC: 34.4 g/dL (ref 32.0–36.0)
MCV: 93.6 fL (ref 80.0–100.0)
MPV: 10.6 fL (ref 7.5–12.5)
Monocytes Relative: 5.7 %
Neutro Abs: 9929 cells/uL — ABNORMAL HIGH (ref 1500–7800)
Neutrophils Relative %: 74.1 %
Platelets: 324 10*3/uL (ref 140–400)
RBC: 5.16 10*6/uL — ABNORMAL HIGH (ref 3.80–5.10)
RDW: 12.2 % (ref 11.0–15.0)
Total Lymphocyte: 18.8 %
WBC: 13.4 10*3/uL — ABNORMAL HIGH (ref 3.8–10.8)

## 2020-11-09 LAB — HEMOGLOBIN A1C
Hgb A1c MFr Bld: 5.6 % of total Hgb (ref <5.7)
Mean Plasma Glucose: 114 mg/dL
eAG (mmol/L): 6.3 mmol/L

## 2020-11-09 LAB — LIPID PANEL W/REFLEX DIRECT LDL
Cholesterol: 134 mg/dL (ref <200)
HDL: 34 mg/dL — ABNORMAL LOW (ref >=50)
LDL Cholesterol (Calc): 78 mg/dL (calc)
Non-HDL Cholesterol (Calc): 100 mg/dL (calc) (ref <130)
Total CHOL/HDL Ratio: 3.9 (calc) (ref <5.0)
Triglycerides: 127 mg/dL (ref <150)

## 2020-11-09 LAB — TSH: TSH: 1.44 m[IU]/L (ref 0.40–4.50)

## 2020-11-09 LAB — MAGNESIUM: Magnesium: 1.9 mg/dL (ref 1.5–2.5)

## 2020-11-09 NOTE — Progress Notes (Signed)
Ashley Scott,   Cholesterol looks good. HDL, good cholesterol, could be better. Stopping smoking and weight loss could help with that.  Kidney, liver, glucose looks good.  Thyroid looks great.  A1C better than 2years ago. Out of pre-diabetes range.  White blood count a little high likely due to COPD exacerbation.

## 2020-11-17 DIAGNOSIS — J441 Chronic obstructive pulmonary disease with (acute) exacerbation: Secondary | ICD-10-CM | POA: Insufficient documentation

## 2020-11-17 DIAGNOSIS — L720 Epidermal cyst: Secondary | ICD-10-CM | POA: Insufficient documentation

## 2020-11-17 DIAGNOSIS — M720 Palmar fascial fibromatosis [Dupuytren]: Secondary | ICD-10-CM | POA: Insufficient documentation

## 2020-11-17 DIAGNOSIS — J309 Allergic rhinitis, unspecified: Secondary | ICD-10-CM | POA: Insufficient documentation

## 2020-11-17 DIAGNOSIS — K1379 Other lesions of oral mucosa: Secondary | ICD-10-CM | POA: Insufficient documentation

## 2020-11-17 DIAGNOSIS — M65332 Trigger finger, left middle finger: Secondary | ICD-10-CM | POA: Insufficient documentation

## 2020-11-22 ENCOUNTER — Ambulatory Visit: Payer: Medicare HMO | Admitting: Sports Medicine

## 2020-12-01 ENCOUNTER — Other Ambulatory Visit: Payer: Medicare HMO

## 2020-12-10 ENCOUNTER — Telehealth: Payer: Self-pay | Admitting: Neurology

## 2020-12-10 NOTE — Telephone Encounter (Signed)
Patient called and left vm wanting a letter to be excused from [employer]. She also wanted to discuss her blood pressure medication with Quantavia Frith.

## 2020-12-13 ENCOUNTER — Other Ambulatory Visit: Payer: Self-pay | Admitting: Physician Assistant

## 2020-12-13 MED ORDER — LOSARTAN POTASSIUM-HCTZ 50-12.5 MG PO TABS: 1.0000 | ORAL_TABLET | Freq: Every day | ORAL | 0 refills | Status: DC

## 2020-12-13 NOTE — Progress Notes (Signed)
Concern about cough and ACE inhibitor. Trial off ACE. Sent [facility].

## 2020-12-13 NOTE — Telephone Encounter (Signed)
[employer] scientific officer. Patient aware. She states she has had a chronic cough and wondering if Lisinopril is contributing. Please advise.

## 2020-12-13 NOTE — Telephone Encounter (Signed)
Patient made aware, will let us know how she does on Losartan HCTZ.

## 2020-12-27 ENCOUNTER — Other Ambulatory Visit: Payer: Self-pay | Admitting: Physician Assistant

## 2020-12-27 ENCOUNTER — Ambulatory Visit (INDEPENDENT_AMBULATORY_CARE_PROVIDER_SITE_OTHER): Payer: Medicare HMO | Admitting: Otolaryngology

## 2020-12-27 DIAGNOSIS — I10 Essential (primary) hypertension: Secondary | ICD-10-CM

## 2021-01-30 ENCOUNTER — Other Ambulatory Visit: Payer: Self-pay | Admitting: Physician Assistant

## 2021-01-30 DIAGNOSIS — N3281 Overactive bladder: Secondary | ICD-10-CM

## 2021-01-31 NOTE — Telephone Encounter (Signed)
Please advise on alternative.  

## 2021-02-01 DIAGNOSIS — D128 Benign neoplasm of rectum: Secondary | ICD-10-CM | POA: Diagnosis not present

## 2021-02-01 DIAGNOSIS — D122 Benign neoplasm of ascending colon: Secondary | ICD-10-CM | POA: Diagnosis not present

## 2021-02-01 DIAGNOSIS — Z86010 Personal history of colonic polyps: Secondary | ICD-10-CM | POA: Diagnosis not present

## 2021-02-01 DIAGNOSIS — Z09 Encounter for follow-up examination after completed treatment for conditions other than malignant neoplasm: Secondary | ICD-10-CM | POA: Diagnosis not present

## 2021-02-01 DIAGNOSIS — K621 Rectal polyp: Secondary | ICD-10-CM | POA: Diagnosis not present

## 2021-02-01 DIAGNOSIS — J439 Emphysema, unspecified: Secondary | ICD-10-CM | POA: Diagnosis not present

## 2021-02-01 DIAGNOSIS — Z1211 Encounter for screening for malignant neoplasm of colon: Secondary | ICD-10-CM | POA: Diagnosis not present

## 2021-03-12 ENCOUNTER — Other Ambulatory Visit: Payer: Self-pay | Admitting: Physician Assistant

## 2021-03-16 ENCOUNTER — Ambulatory Visit (INDEPENDENT_AMBULATORY_CARE_PROVIDER_SITE_OTHER): Payer: Medicare HMO | Admitting: Physician Assistant

## 2021-03-16 ENCOUNTER — Other Ambulatory Visit: Payer: Self-pay | Admitting: Physician Assistant

## 2021-03-16 ENCOUNTER — Ambulatory Visit (INDEPENDENT_AMBULATORY_CARE_PROVIDER_SITE_OTHER): Payer: Medicare HMO

## 2021-03-16 ENCOUNTER — Other Ambulatory Visit: Payer: Self-pay

## 2021-03-16 ENCOUNTER — Encounter: Payer: Self-pay | Admitting: Physician Assistant

## 2021-03-16 VITALS — BP 119/69 | HR 87 | Ht 67.5 in | Wt 216.0 lb

## 2021-03-16 DIAGNOSIS — M19011 Primary osteoarthritis, right shoulder: Secondary | ICD-10-CM | POA: Diagnosis not present

## 2021-03-16 DIAGNOSIS — R202 Paresthesia of skin: Secondary | ICD-10-CM

## 2021-03-16 DIAGNOSIS — M549 Dorsalgia, unspecified: Secondary | ICD-10-CM

## 2021-03-16 DIAGNOSIS — W19XXXA Unspecified fall, initial encounter: Secondary | ICD-10-CM

## 2021-03-16 DIAGNOSIS — M25511 Pain in right shoulder: Secondary | ICD-10-CM

## 2021-03-16 DIAGNOSIS — R200 Anesthesia of skin: Secondary | ICD-10-CM | POA: Diagnosis not present

## 2021-03-16 DIAGNOSIS — M50322 Other cervical disc degeneration at C5-C6 level: Secondary | ICD-10-CM | POA: Diagnosis not present

## 2021-03-16 MED ORDER — CYCLOBENZAPRINE HCL 5 MG PO TABS: 5.0000 mg | ORAL_TABLET | Freq: Three times a day (TID) | ORAL | 0 refills | Status: DC | PRN

## 2021-03-16 MED ORDER — MELOXICAM 15 MG PO TABS: 15.0000 mg | ORAL_TABLET | Freq: Every day | ORAL | 1 refills | Status: DC

## 2021-03-16 MED ORDER — KETOCONAZOLE 2 % EX CREA: 1.0000 "application " | TOPICAL_CREAM | Freq: Every day | CUTANEOUS | 1 refills | Status: DC

## 2021-03-16 MED ORDER — PREDNISONE 20 MG PO TABS: 20.0000 mg | ORAL_TABLET | Freq: Two times a day (BID) | ORAL | 0 refills | Status: DC

## 2021-03-16 NOTE — Progress Notes (Signed)
l °

## 2021-03-16 NOTE — Patient Instructions (Addendum)
Tens unit  Icy hot patches Flexeril up to three times a day Stop aleve start mobic once a day  Thoracic Strain A thoracic strain is an injury to the muscles or tendons that attach to the upper back. Tendons are tissues that connect muscle to bone. This injury is sometimes called a mid-back strain. A strain can be mild or very bad. A mild strain may take only 1-2 weeks to heal. A very bad strain involves torn muscles or tendons, so it may take 6-8 weeks to heal. What are the causes? This condition may be caused by: A fall or a hit to the body. Twisting or stretching the back too far. This may happen when doing activities that require a lot of energy, such as lifting heavy objects. In some cases, the cause may not be known. What increases the risk? This injury is more common in: Athletes. People who are very overweight (obese). What are the signs or symptoms? Pain in the middle back, especially with movement. This is the main symptom. Stiffness or limited range of motion. Sudden muscle tightening (spasms). How is this treated? This condition may be treated with: Resting the injured area. Putting heat and cold on the injured area. Medicines for pain and inflammation, such as NSAIDs. Prescription medicine for pain or to relax the muscles. These may be used for a short time if needed. Physical therapy. This will involve doing exercises to stretch and strengthen the middle back. Follow these instructions at home: Managing pain, stiffness, and swelling   If told, put ice on the injured area. Put ice in a plastic bag. Place a towel between your skin and the bag. Leave the ice on for 20 minutes, 2-3 times a day. If told, put heat on the affected area. Do this as often as told by your doctor. Use the heat source that your doctor recommends, such as a moist heat pack or a heating pad. Place a towel between your skin and the heat source. Leave the heat on for 20-30 minutes. Remove the heat  if your skin turns bright red. This is very important if you are unable to feel pain, heat, or cold. You may have a greater risk of getting burned. Activity Rest and return to your normal activities as told by your doctor. Ask your doctor what activities are safe for you. Do exercises as told by your doctor. Medicines Take over-the-counter and prescription medicines only as told by your doctor. Ask your doctor if the medicine prescribed to you: Requires you to avoid driving or using heavy machinery. Can cause trouble pooping (constipation). You may need to take steps to prevent or treat trouble pooping: Drink enough fluid to keep your pee (urine) pale yellow. Take over-the-counter or prescription medicines. Eat foods that are high in fiber. These include beans, whole grains, and fresh fruits and vegetables. Limit foods that are high in fat and processed sugars. These include fried or sweet foods. Injury prevention To prevent a future mid-back injury: Always warm up before physical activity or sports. Cool down and stretch after being active. Use correct form when playing sports and lifting heavy objects. Bend your knees before you lift heavy objects. Use good posture when sitting and standing. Stay physically fit and maintain a healthy weight. Do at least 150 minutes of moderate-intensity exercise each week, such as brisk walking or water aerobics. Do strength exercises at least 2 times each week.  General instructions Do not use any products that contain nicotine or  tobacco. These products include cigarettes, e-cigarettes, and chewing tobacco. If you need help quitting, ask your doctor. Keep all follow-up visits as told by your doctor. This is important. Contact a doctor if: Your pain is not helped by medicine. Your pain or stiffness is getting worse. You have pain or stiffness in your neck or lower back. Get help right away if you: Have shortness of breath. Have chest pain. Have  weakness or loss of feeling (numbness) in your legs or arms. Cannot control when you pee (urinate). Summary A thoracic strain is an injury to the muscles or tendons that attach to the upper back. If told, put ice or heat on the affected area. Rest and return to your normal activities as told by your doctor. Keep all follow-up visits as told by your doctor. This information is not intended to replace advice given to you by your health care provider. Make sure you discuss any questions you have with your health care provider. Document Revised: 02/05/2018 Document Reviewed: 02/05/2018 Elsevier Patient Education  2022 [employer].

## 2021-03-21 ENCOUNTER — Encounter: Payer: Self-pay | Admitting: Physician Assistant

## 2021-03-21 DIAGNOSIS — M5030 Other cervical disc degeneration, unspecified cervical region: Secondary | ICD-10-CM | POA: Insufficient documentation

## 2021-03-21 DIAGNOSIS — M19011 Primary osteoarthritis, right shoulder: Secondary | ICD-10-CM | POA: Insufficient documentation

## 2021-03-21 NOTE — Progress Notes (Signed)
Visible muscle spasm of the neck and cervical spine with arthritis of C5,6,7 which correlate to hand/finger numbness.

## 2021-03-21 NOTE — Progress Notes (Signed)
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 Could benefit from injection with Dr. Darene Lamer our sports medicine provider.

## 2021-03-29 NOTE — Progress Notes (Signed)
Acute Office Visit  Subjective:    Patient ID: Ashley Scott, female    DOB: [birth year], 65 y.o.   MRN: [id number]  Chief Complaint  Patient presents with   Shoulder Pain    HPI Patient is in today for right shoulder pain for the last few months but started after a fall where she landed on her right shoulder. She is taking aleve with little improvement. Pt is finding it more and more difficult to have ROM in right arm and sleep on right side.   Past Medical History:  Diagnosis Date   Anxiety    Asthma    COPD (chronic obstructive pulmonary disease) ([place])    Herpes    Hypercholesteremia    Hypertension    Seizure-like activity ([place])     Past Surgical History:  Procedure Laterality Date   PARTIAL HYSTERECTOMY     right wrist surgery     TONSILLECTOMY AND ADENOIDECTOMY     WISDOM TOOTH EXTRACTION      Family History  Problem Relation Age of Onset   Stomach cancer Mother    Brain cancer Mother    Hypertension Mother    Hypercholesterolemia Mother    Hypercholesterolemia Father    Diabetes Father    Hypertension Father    Liver disease Sister    Heart attack Maternal Grandfather    Brain cancer Maternal Grandfather    Breast cancer Maternal Aunt     Social History   Socioeconomic History   Marital status: Divorced    Spouse name: Not on file   Number of children: Not on file   Years of education: Not on file   Highest education level: Not on file  Occupational History   Not on file  Tobacco Use   Smoking status: Former    Packs/day: 1.00    Types: Cigarettes   Smokeless tobacco: Never  Substance and Sexual Activity   Alcohol use: Yes    Comment: very rarely   Drug use: Never   Sexual activity: Not Currently  Other Topics Concern   Not on file  Social History Narrative   Not on file   Social Determinants of Health   Financial Resource Strain: Not on file  Food Insecurity: Not on file  Transportation Needs: Not on file  Physical Activity: Not on  file  Stress: Not on file  Social Connections: Not on file  Intimate Partner Violence: Not on file    Outpatient Medications Prior to Visit  Medication Sig Dispense Refill   albuterol (VENTOLIN HFA) 108 (90 Base) MCG/ACT inhaler INHALE 2 PUFFS BY MOUTH EVERY 6 HOURS AS NEEDED FOR WHEEZING 17 each 5   AMBULATORY NON FORMULARY MEDICATION shingrx 2 doses to prevent shingles. 2 application 0   diclofenac sodium (VOLTAREN) 1 % GEL Apply 4 g topically 4 (four) times daily. To affected joint. 100 g 1   Fluticasone-Umeclidin-Vilant (TRELEGY ELLIPTA) 100-62.5-25 MCG/INH AEPB TAKE 1 PUFF BY MOUTH EVERY DAY 180 each 3   ipratropium-albuterol (DUONEB) 0.5-2.5 (3) MG/3ML SOLN Take 3 mLs by nebulization every 4 (four) hours as needed. 360 mL 0   losartan-hydrochlorothiazide (HYZAAR) 50-12.5 MG tablet TAKE 1 TABLET BY MOUTH EVERY DAY 90 tablet 0   montelukast (SINGULAIR) 10 MG tablet Take 1 tablet (10 mg total) by mouth at bedtime. 90 tablet 3   nicotine (NICODERM CQ) 21 mg/24hr patch Place 1 patch (21 mg total) onto the skin daily. 28 patch 1   rosuvastatin (CRESTOR) 10 MG tablet Take  1 tablet (10 mg total) by mouth daily. 90 tablet 3   sertraline (ZOLOFT) 100 MG tablet Take 1 tablet (100 mg total) by mouth daily. 90 tablet 3   solifenacin (VESICARE) 5 MG tablet Take 1 tablet (5 mg total) by mouth daily. 90 tablet 1   traZODone (DESYREL) 50 MG tablet TAKE 0.5-1 TABLETS BY MOUTH AT BEDTIME AS NEEDED FOR SLEEP. 90 tablet 0   lisinopril-hydrochlorothiazide (ZESTORETIC) 20-12.5 MG tablet Take 1 tablet by mouth daily. 90 tablet 1   predniSONE (DELTASONE) 50 MG tablet Take one tablet daily. 5 tablet 0   No facility-administered medications prior to visit.    Allergies  Allergen Reactions   Oxycodone-Acetaminophen Other (See Comments)    Shaking uncontrolable   Bupropion     Seizures    Review of Systems See HPI.     Objective:    Physical Exam Right shoulder:  Decreased ROM due to pain about 120  degrees Pain with external and internal ROM Very tight upper right shoulder muscles and into paraspinal cervical muscles Negative drop arm sign Negative speeds or yeragson. Pain with palpation over ACE joint and deltoid BP 119/69    Pulse 87    Ht 5' 7.5" (1.715 m)    Wt 216 lb (98 kg)    SpO2 93%    BMI 33.33 kg/m  Wt Readings from Last 3 Encounters:  03/16/21 216 lb (98 kg)  11/08/20 226 lb (102.5 kg)  05/10/20 224 lb (101.6 kg)    Health Maintenance Due  Topic Date Due   Hepatitis C Screening  Never done   Zoster Vaccines- Shingrix (1 of 2) Never done   COVID-19 Vaccine (3 - Booster for Janssen series) 04/09/2020   DEXA SCAN  Never done   INFLUENZA VACCINE  11/01/2020    There are no preventive care reminders to display for this patient.   Lab Results  Component Value Date   TSH 1.44 11/08/2020   Lab Results  Component Value Date   WBC 13.4 (H) 11/08/2020   HGB 16.6 (H) 11/08/2020   HCT 48.3 (H) 11/08/2020   MCV 93.6 11/08/2020   PLT 324 11/08/2020   Lab Results  Component Value Date   NA 137 11/08/2020   K 4.2 11/08/2020   CO2 30 11/08/2020   GLUCOSE 89 11/08/2020   BUN 8 11/08/2020   CREATININE 0.56 11/08/2020   BILITOT 0.7 11/08/2020   ALKPHOS 75 09/21/2016   AST 14 11/08/2020   ALT 10 11/08/2020   PROT 7.2 11/08/2020   CALCIUM 10.4 11/08/2020   EGFR 101 11/08/2020   Lab Results  Component Value Date   CHOL 134 11/08/2020   Lab Results  Component Value Date   HDL 34 (L) 11/08/2020   Lab Results  Component Value Date   LDLCALC 78 11/08/2020   Lab Results  Component Value Date   TRIG 127 11/08/2020   Lab Results  Component Value Date   CHOLHDL 3.9 11/08/2020   Lab Results  Component Value Date   HGBA1C 5.6 11/08/2020       Assessment & Plan:  Marland KitchenMarland KitchenKashae was seen today for shoulder pain.  Diagnoses and all orders for this visit:  Fall, initial encounter -     DG Shoulder Right; Future -     DG Cervical Spine Complete; Future -      meloxicam (MOBIC) 15 MG tablet; Take 1 tablet (15 mg total) by mouth daily.  Acute pain of right shoulder -  DG Shoulder Right; Future -     DG Cervical Spine Complete; Future -     meloxicam (MOBIC) 15 MG tablet; Take 1 tablet (15 mg total) by mouth daily. -     predniSONE (DELTASONE) 20 MG tablet; Take 1 tablet (20 mg total) by mouth 2 (two) times daily with a meal.  Numbness and tingling of right arm -     DG Shoulder Right; Future -     DG Cervical Spine Complete; Future -     meloxicam (MOBIC) 15 MG tablet; Take 1 tablet (15 mg total) by mouth daily. -     predniSONE (DELTASONE) 20 MG tablet; Take 1 tablet (20 mg total) by mouth 2 (two) times daily with a meal.  Other orders -     ketoconazole (NIZORAL) 2 % cream; Apply 1 application topically daily. -     Discontinue: cyclobenzaprine (FLEXERIL) 5 MG tablet; Take 1 tablet (5 mg total) by mouth 3 (three) times daily as needed for muscle spasms.   Unclear etiology of symptoms.suspect some OA.  Needs xray Declines injection Prednisone taper started After finish start mobic daily Top aleve/ibuprofen Flexeril given to use as needed.  Icy hot patches and tens units encouraged Exercises given consider formal PT.        Iran Planas, PA-C

## 2021-04-01 ENCOUNTER — Ambulatory Visit: Payer: Medicare HMO | Admitting: Sports Medicine

## 2021-04-08 ENCOUNTER — Ambulatory Visit: Payer: Medicare HMO | Admitting: Sports Medicine

## 2021-04-19 ENCOUNTER — Other Ambulatory Visit: Payer: Self-pay

## 2021-04-19 ENCOUNTER — Ambulatory Visit (INDEPENDENT_AMBULATORY_CARE_PROVIDER_SITE_OTHER): Payer: Medicare HMO | Admitting: Physician Assistant

## 2021-04-19 ENCOUNTER — Encounter: Payer: Self-pay | Admitting: Physician Assistant

## 2021-04-19 VITALS — BP 130/85 | HR 86 | Ht 67.5 in | Wt 208.0 lb

## 2021-04-19 DIAGNOSIS — J42 Unspecified chronic bronchitis: Secondary | ICD-10-CM

## 2021-04-19 DIAGNOSIS — Z1159 Encounter for screening for other viral diseases: Secondary | ICD-10-CM

## 2021-04-19 DIAGNOSIS — Z23 Encounter for immunization: Secondary | ICD-10-CM

## 2021-04-19 DIAGNOSIS — Z13820 Encounter for screening for osteoporosis: Secondary | ICD-10-CM

## 2021-04-19 DIAGNOSIS — Z0000 Encounter for general adult medical examination without abnormal findings: Secondary | ICD-10-CM

## 2021-04-19 DIAGNOSIS — Z131 Encounter for screening for diabetes mellitus: Secondary | ICD-10-CM | POA: Diagnosis not present

## 2021-04-19 DIAGNOSIS — R0902 Hypoxemia: Secondary | ICD-10-CM

## 2021-04-19 DIAGNOSIS — R0602 Shortness of breath: Secondary | ICD-10-CM | POA: Diagnosis not present

## 2021-04-19 DIAGNOSIS — Z122 Encounter for screening for malignant neoplasm of respiratory organs: Secondary | ICD-10-CM

## 2021-04-19 DIAGNOSIS — J9611 Chronic respiratory failure with hypoxia: Secondary | ICD-10-CM

## 2021-04-19 DIAGNOSIS — Z716 Tobacco abuse counseling: Secondary | ICD-10-CM | POA: Diagnosis not present

## 2021-04-19 DIAGNOSIS — L299 Pruritus, unspecified: Secondary | ICD-10-CM

## 2021-04-19 MED ORDER — VARENICLINE TARTRATE 1 MG PO TABS: 1.0000 mg | ORAL_TABLET | Freq: Two times a day (BID) | ORAL | 1 refills | Status: DC

## 2021-04-19 MED ORDER — AMBULATORY NON FORMULARY MEDICATION: 0 refills | Status: DC

## 2021-04-19 MED ORDER — TRIAMCINOLONE ACETONIDE 0.1 % EX CREA: 1.0000 "application " | TOPICAL_CREAM | Freq: Two times a day (BID) | CUTANEOUS | 0 refills | Status: DC

## 2021-04-19 MED ORDER — VARENICLINE TARTRATE 0.5 MG PO TABS: 0.5000 mg | ORAL_TABLET | Freq: Two times a day (BID) | ORAL | 0 refills | Status: DC

## 2021-04-19 NOTE — Patient Instructions (Addendum)
Dexa scan ordered Increase duoneb to twice a day.  Will get oxygen set up for patient  Health Maintenance, Female Adopting a healthy lifestyle and getting preventive care are important in promoting health and wellness. Ask your health care provider about: The right schedule for you to have regular tests and exams. Things you can do on your own to prevent diseases and keep yourself healthy. What should I know about diet, weight, and exercise? Eat a healthy diet  Eat a diet that includes plenty of vegetables, fruits, low-fat dairy products, and lean protein. Do not eat a lot of foods that are high in solid fats, added sugars, or sodium. Maintain a healthy weight Body mass index (BMI) is used to identify weight problems. It estimates body fat based on height and weight. Your health care provider can help determine your BMI and help you achieve or maintain a healthy weight. Get regular exercise Get regular exercise. This is one of the most important things you can do for your health. Most adults should: Exercise for at least 150 minutes each week. The exercise should increase your heart rate and make you sweat (moderate-intensity exercise). Do strengthening exercises at least twice a week. This is in addition to the moderate-intensity exercise. Spend less time sitting. Even light physical activity can be beneficial. Watch cholesterol and blood lipids Have your blood tested for lipids and cholesterol at 66 years of age, then have this test every 5 years. Have your cholesterol levels checked more often if: Your lipid or cholesterol levels are high. You are older than 66 years of age. You are at high risk for heart disease. What should I know about cancer screening? Depending on your health history and family history, you may need to have cancer screening at various ages. This may include screening for: Breast cancer. Cervical cancer. Colorectal cancer. Skin cancer. Lung cancer. What should  I know about heart disease, diabetes, and high blood pressure? Blood pressure and heart disease High blood pressure causes heart disease and increases the risk of stroke. This is more likely to develop in people who have high blood pressure readings or are overweight. Have your blood pressure checked: Every 3-5 years if you are 66-80 years of age. Every year if you are 66 years old or older. Diabetes Have regular diabetes screenings. This checks your fasting blood sugar level. Have the screening done: Once every three years after age 66 if you are at a normal weight and have a low risk for diabetes. More often and at a younger age if you are overweight or have a high risk for diabetes. What should I know about preventing infection? Hepatitis B If you have a higher risk for hepatitis B, you should be screened for this virus. Talk with your health care provider to find out if you are at risk for hepatitis B infection. Hepatitis C Testing is recommended for: Everyone born from [birth year] through 1965. Anyone with known risk factors for hepatitis C. Sexually transmitted infections (STIs) Get screened for STIs, including gonorrhea and chlamydia, if: You are sexually active and are younger than 66 years of age. You are older than 66 years of age and your health care provider tells you that you are at risk for this type of infection. Your sexual activity has changed since you were last screened, and you are at increased risk for chlamydia or gonorrhea. Ask your health care provider if you are at risk. Ask your health care provider about whether you are  at high risk for HIV. Your health care provider may recommend a prescription medicine to help prevent HIV infection. If you choose to take medicine to prevent HIV, you should first get tested for HIV. You should then be tested every 3 months for as long as you are taking the medicine. Pregnancy If you are about to stop having your period (premenopausal) and  you may become pregnant, seek counseling before you get pregnant. Take 400 to 800 micrograms (mcg) of folic acid every day if you become pregnant. Ask for birth control (contraception) if you want to prevent pregnancy. Osteoporosis and menopause Osteoporosis is a disease in which the bones lose minerals and strength with aging. This can result in bone fractures. If you are 66 years old or older, or if you are at risk for osteoporosis and fractures, ask your health care provider if you should: Be screened for bone loss. Take a calcium or vitamin D supplement to lower your risk of fractures. Be given hormone replacement therapy (HRT) to treat symptoms of menopause. Follow these instructions at home: Alcohol use Do not drink alcohol if: Your health care provider tells you not to drink. You are pregnant, may be pregnant, or are planning to become pregnant. If you drink alcohol: Limit how much you have to: 0-1 drink a day. Know how much alcohol is in your drink. In the [place] one drink equals one 12 oz bottle of beer (355 mL), one 5 oz glass of wine (148 mL), or one 1 oz glass of hard liquor (44 mL). Lifestyle Do not use any products that contain nicotine or tobacco. These products include cigarettes, chewing tobacco, and vaping devices, such as e-cigarettes. If you need help quitting, ask your health care provider. Do not use street drugs. Do not share needles. Ask your health care provider for help if you need support or information about quitting drugs. General instructions Schedule regular health, dental, and eye exams. Stay current with your vaccines. Tell your health care provider if: You often feel depressed. You have ever been abused or do not feel safe at home. Summary Adopting a healthy lifestyle and getting preventive care are important in promoting health and wellness. Follow your health care provider's instructions about healthy diet, exercising, and getting tested or screened  for diseases. Follow your health care provider's instructions on monitoring your cholesterol and blood pressure. This information is not intended to replace advice given to you by your health care provider. Make sure you discuss any questions you have with your health care provider. Document Revised: 08/09/2020 Document Reviewed: 08/09/2020 Elsevier Patient Education  [place].

## 2021-04-19 NOTE — Progress Notes (Addendum)
Subjective:    Ashley Scott is a 66 y.o. female who presents for a Welcome to Medicare exam.   Review of Systems   Pt continues to be very fatigued and short of breath. Even very minimal activity causes shortness of breath. She has COPD and using trelegy daily with albuterol inhaler multiple times a day. She uses duoneb as well too.       Objective:    Today's Vitals   04/19/21 1328  BP: 130/85  Pulse: 86  SpO2: 93%  Weight: 208 lb (94.3 kg)  Height: 5' 7.5" (1.715 m)  Body mass index is 32.1 kg/m.  Medications Outpatient Encounter Medications as of 04/19/2021  Medication Sig   albuterol (VENTOLIN HFA) 108 (90 Base) MCG/ACT inhaler INHALE 2 PUFFS BY MOUTH EVERY 6 HOURS AS NEEDED FOR WHEEZING   AMBULATORY NON FORMULARY MEDICATION shingrx 2 doses to prevent shingles.   AMBULATORY NON FORMULARY MEDICATION shingrx 2 doses to prevent shingles.   cyclobenzaprine (FLEXERIL) 5 MG tablet TAKE 1 TABLET BY MOUTH THREE TIMES A DAY AS NEEDED FOR MUSCLE SPASMS   diclofenac sodium (VOLTAREN) 1 % GEL Apply 4 g topically 4 (four) times daily. To affected joint.   Fluticasone-Umeclidin-Vilant (TRELEGY ELLIPTA) 100-62.5-25 MCG/INH AEPB TAKE 1 PUFF BY MOUTH EVERY DAY   ipratropium-albuterol (DUONEB) 0.5-2.5 (3) MG/3ML SOLN Take 3 mLs by nebulization every 4 (four) hours as needed.   ketoconazole (NIZORAL) 2 % cream Apply 1 application topically daily.   meloxicam (MOBIC) 15 MG tablet Take 1 tablet (15 mg total) by mouth daily.   montelukast (SINGULAIR) 10 MG tablet Take 1 tablet (10 mg total) by mouth at bedtime.   rosuvastatin (CRESTOR) 10 MG tablet Take 1 tablet (10 mg total) by mouth daily.   sertraline (ZOLOFT) 100 MG tablet Take 1 tablet (100 mg total) by mouth daily.   solifenacin (VESICARE) 5 MG tablet Take 1 tablet (5 mg total) by mouth daily.   traZODone (DESYREL) 50 MG tablet TAKE 0.5-1 TABLETS BY MOUTH AT BEDTIME AS NEEDED FOR SLEEP.   triamcinolone cream (KENALOG) 0.1 % Apply 1  application topically 2 (two) times daily.   varenicline (CHANTIX CONTINUING MONTH PAK) 1 MG tablet Take 1 tablet (1 mg total) by mouth 2 (two) times daily.   varenicline (CHANTIX) 0.5 MG tablet Take 1 tablet (0.5 mg total) by mouth 2 (two) times daily.   [DISCONTINUED] losartan-hydrochlorothiazide (HYZAAR) 50-12.5 MG tablet TAKE 1 TABLET BY MOUTH EVERY DAY   [DISCONTINUED] nicotine (NICODERM CQ) 21 mg/24hr patch Place 1 patch (21 mg total) onto the skin daily.   [DISCONTINUED] predniSONE (DELTASONE) 20 MG tablet Take 1 tablet (20 mg total) by mouth 2 (two) times daily with a meal.   No facility-administered encounter medications on file as of 04/19/2021.     History: Past Medical History:  Diagnosis Date   Anxiety    Asthma    COPD (chronic obstructive pulmonary disease) ([place])    Herpes    Hypercholesteremia    Hypertension    Seizure-like activity ([facility])    Past Surgical History:  Procedure Laterality Date   PARTIAL HYSTERECTOMY     right wrist surgery     TONSILLECTOMY AND ADENOIDECTOMY     WISDOM TOOTH EXTRACTION      Family History  Problem Relation Age of Onset   Stomach cancer Mother    Brain cancer Mother    Hypertension Mother    Hypercholesterolemia Mother    Hypercholesterolemia Father    Diabetes Father  Hypertension Father    Liver disease Sister    Heart attack Maternal Grandfather    Brain cancer Maternal Grandfather    Breast cancer Maternal Aunt    Social History   Occupational History   Not on file  Tobacco Use   Smoking status: Former    Packs/day: 1.00    Types: Cigarettes   Smokeless tobacco: Never  Substance and Sexual Activity   Alcohol use: Yes    Comment: very rarely   Drug use: Never   Sexual activity: Not Currently    Tobacco Counseling Pt is going to start chantix to stop smoking.  Immunizations and Health Maintenance Immunization History  Administered Date(s) Administered   Influenza, High Dose Seasonal PF 04/19/2021    Influenza, Seasonal, Injecte, Preservative Fre 02/13/2014, 03/18/2015, 01/19/2017   Influenza,inj,Quad PF,6+ Mos 03/21/2016, 02/04/2018, 01/26/2020   Influenza-Unspecified 03/18/2015, 03/21/2016, 01/19/2017   Janssen (J&J) SARS-COV-2 Vaccination 07/29/2019   Moderna Sars-Covid-2 Vaccination 02/13/2020   PNEUMOCOCCAL CONJUGATE-20 11/08/2020   Pneumococcal Polysaccharide-23 04/26/2011   Pneumococcal-Unspecified 04/26/2011   Tdap 09/05/2007, 11/02/2017   Zoster, Live 09/20/2015   Health Maintenance Due  Topic Date Due   COVID-19 Vaccine (3 - Booster for Janssen series) 04/09/2020   DEXA SCAN  Never done    Activities of Daily Living In your present state of health, do you have any difficulty performing the following activities: 04/22/2021  Hearing? N  Vision? N  Difficulty concentrating or making decisions? N  Walking or climbing stairs? Y  Comment due to SOB  Dressing or bathing? N  Doing errands, shopping? N  Some recent data might be hidden    Physical Exam    NSR Coarse breath sounds throughout both lungs with scattered wheezing.       Assessment:    This is a routine wellness examination for this patient .   Dietary issues and exercise activities discussed:      Goals   None    Depression Screen PHQ 2/9 Scores 04/19/2021 11/08/2020 05/10/2020 06/13/2019  PHQ - 2 Score 1 1 1 2   PHQ- 9 Score 11 11 13 13      Fall Risk Fall Risk  04/19/2021  Falls in the past year? 0  Number falls in past yr: 0  Injury with Fall? 0  Risk for fall due to : Impaired mobility;Impaired balance/gait  Follow up Falls evaluation completed;Education provided;Falls prevention discussed    Cognitive Function:     6CIT Screen 04/19/2021  What Year? 0 points  What month? 0 points  What time? 0 points  Count back from 20 0 points  Months in reverse 0 points  Repeat phrase 0 points  Total Score 0    Patient Care Team: Lavada Mesi as PCP - General (Family Medicine)      Plan:    Discussed smoking cessation.  Chantix sent to pharmacy.  Goal to be stopped smoking in one month.  Needs lung cancer screening Will call [facility] for colonoscopy. Flu shot given today.  Shingles to get at pharmacy.  COPD/SOB 6 minute walk test  93 percent at rest with no O2 At 2 minutes 87 percent with no O2 97 percent with 2L of O2 at rest.  Order for home O2.  Continue trelegy, duoneb,albuterol.  I have personally reviewed and noted the following in the patients chart:   Medical and social history Use of alcohol, tobacco or illicit drugs  Current medications and supplements Functional ability and status Nutritional status Physical  activity Advanced directives List of other physicians Hospitalizations, surgeries, and ER visits in previous 12 months Vitals Screenings to include cognitive, depression, and falls Referrals and appointments  In addition, I have reviewed and discussed with patient certain preventive protocols, quality metrics, and best practice recommendations. A written personalized care plan for preventive services as well as general preventive health recommendations were provided to patient.     Iran Planas, PA-C 04/19/2021

## 2021-04-20 LAB — COMPLETE METABOLIC PANEL WITH GFR
AG Ratio: 1.5 (calc) (ref 1.0–2.5)
ALT: 16 U/L (ref 6–29)
AST: 17 U/L (ref 10–35)
Albumin: 4 g/dL (ref 3.6–5.1)
Alkaline phosphatase (APISO): 61 U/L (ref 37–153)
BUN: 8 mg/dL (ref 7–25)
CO2: 29 mmol/L (ref 20–32)
Calcium: 9.7 mg/dL (ref 8.6–10.4)
Chloride: 101 mmol/L (ref 98–110)
Creat: 0.77 mg/dL (ref 0.50–1.05)
Globulin: 2.7 g/dL (calc) (ref 1.9–3.7)
Glucose, Bld: 109 mg/dL — ABNORMAL HIGH (ref 65–99)
Potassium: 4.3 mmol/L (ref 3.5–5.3)
Sodium: 138 mmol/L (ref 135–146)
Total Bilirubin: 0.5 mg/dL (ref 0.2–1.2)
Total Protein: 6.7 g/dL (ref 6.1–8.1)
eGFR: 86 mL/min/{1.73_m2} (ref >=60)

## 2021-04-20 LAB — CBC WITH DIFFERENTIAL/PLATELET
Absolute Monocytes: 650 cells/uL (ref 200–950)
Basophils Absolute: 45 cells/uL (ref 0–200)
Basophils Relative: 0.4 %
Eosinophils Absolute: 123 cells/uL (ref 15–500)
Eosinophils Relative: 1.1 %
HCT: 47.1 % — ABNORMAL HIGH (ref 35.0–45.0)
Hemoglobin: 16.4 g/dL — ABNORMAL HIGH (ref 11.7–15.5)
Lymphs Abs: 2195 cells/uL (ref 850–3900)
MCH: 32.2 pg (ref 27.0–33.0)
MCHC: 34.8 g/dL (ref 32.0–36.0)
MCV: 92.4 fL (ref 80.0–100.0)
MPV: 11.1 fL (ref 7.5–12.5)
Monocytes Relative: 5.8 %
Neutro Abs: 8187 cells/uL — ABNORMAL HIGH (ref 1500–7800)
Neutrophils Relative %: 73.1 %
Platelets: 354 10*3/uL (ref 140–400)
RBC: 5.1 10*6/uL (ref 3.80–5.10)
RDW: 12.6 % (ref 11.0–15.0)
Total Lymphocyte: 19.6 %
WBC: 11.2 10*3/uL — ABNORMAL HIGH (ref 3.8–10.8)

## 2021-04-20 LAB — HEPATITIS C ANTIBODY
Hepatitis C Ab: NONREACTIVE
SIGNAL TO CUT-OFF: 0.11 (ref <1.00)

## 2021-04-20 NOTE — Progress Notes (Signed)
Kidney and liver look great.  WBC a little elevated.

## 2021-04-22 ENCOUNTER — Encounter: Payer: Self-pay | Admitting: Physician Assistant

## 2021-04-22 DIAGNOSIS — R0902 Hypoxemia: Secondary | ICD-10-CM | POA: Insufficient documentation

## 2021-04-22 DIAGNOSIS — J9611 Chronic respiratory failure with hypoxia: Secondary | ICD-10-CM | POA: Insufficient documentation

## 2021-04-22 DIAGNOSIS — L299 Pruritus, unspecified: Secondary | ICD-10-CM | POA: Insufficient documentation

## 2021-04-22 NOTE — Addendum Note (Signed)
Addended by: Donella Stade on: 04/22/2021 09:01 AM   Modules accepted: Orders

## 2021-04-25 ENCOUNTER — Other Ambulatory Visit: Payer: Self-pay | Admitting: *Deleted

## 2021-04-25 DIAGNOSIS — Z87891 Personal history of nicotine dependence: Secondary | ICD-10-CM

## 2021-04-25 DIAGNOSIS — F17210 Nicotine dependence, cigarettes, uncomplicated: Secondary | ICD-10-CM

## 2021-05-09 ENCOUNTER — Other Ambulatory Visit: Payer: Self-pay | Admitting: Physician Assistant

## 2021-05-09 DIAGNOSIS — E7800 Pure hypercholesterolemia, unspecified: Secondary | ICD-10-CM

## 2021-05-09 DIAGNOSIS — R7303 Prediabetes: Secondary | ICD-10-CM

## 2021-05-10 ENCOUNTER — Other Ambulatory Visit: Payer: Self-pay

## 2021-05-10 ENCOUNTER — Encounter: Payer: Self-pay | Admitting: Acute Care

## 2021-05-10 ENCOUNTER — Ambulatory Visit (INDEPENDENT_AMBULATORY_CARE_PROVIDER_SITE_OTHER): Payer: Medicare HMO | Admitting: Acute Care

## 2021-05-10 ENCOUNTER — Ambulatory Visit (INDEPENDENT_AMBULATORY_CARE_PROVIDER_SITE_OTHER): Payer: Medicare HMO

## 2021-05-10 DIAGNOSIS — F17210 Nicotine dependence, cigarettes, uncomplicated: Secondary | ICD-10-CM

## 2021-05-10 DIAGNOSIS — Z87891 Personal history of nicotine dependence: Secondary | ICD-10-CM

## 2021-05-10 DIAGNOSIS — R69 Illness, unspecified: Secondary | ICD-10-CM | POA: Diagnosis not present

## 2021-05-10 NOTE — Progress Notes (Signed)
Virtual Visit via Telephone Note  I connected with Ashley Scott on 05/10/21 at 11:00 AM EST by telephone and verified that I am speaking with the correct person using two identifiers.  Location: Patient:  At home Provider: [place], [place], [place], [address]    I discussed the limitations, risks, security and privacy concerns of performing an evaluation and management service by telephone and the availability of in person appointments. I also discussed with the patient that there may be a patient responsible charge related to this service. The patient expressed understanding and agreed to proceed.    Shared Decision Making Visit Lung Cancer Screening Program [id number])   Eligibility: Age 66 y.o. Pack Years Smoking History Calculation 47 pack year smoking history (# packs/per year x # years smoked) Recent History of coughing up blood  no Unexplained weight loss? no ( >Than 15 pounds within the last 6 months ) Prior History Lung / other cancer no (Diagnosis within the last 5 years already requiring surveillance chest CT Scans). Smoking Status Current Smoker Former Smokers: Years since quit:  NA  Quit Date:  NA  Visit Components: Discussion included one or more decision making aids. yes Discussion included risk/benefits of screening. yes Discussion included potential follow up diagnostic testing for abnormal scans. yes Discussion included meaning and risk of over diagnosis. yes Discussion included meaning and risk of False Positives. yes Discussion included meaning of total radiation exposure. yes  Counseling Included: Importance of adherence to annual lung cancer LDCT screening. yes Impact of comorbidities on ability to participate in the program. yes Ability and willingness to under diagnostic treatment. yes  Smoking Cessation Counseling: Current Smokers:  Discussed importance of smoking cessation. yes Information about tobacco cessation classes and interventions  provided to patient. yes Patient provided with "ticket" for LDCT Scan. yes Symptomatic Patient. no  Counseling NA Diagnosis Code: Tobacco Use Z72.0 Asymptomatic Patient yes  Counseling (Intermediate counseling: > three minutes counseling) [id number] Former Smokers:  Discussed the importance of maintaining cigarette abstinence. yes Diagnosis Code: Personal History of Nicotine Dependence. S97.026 Information about tobacco cessation classes and interventions provided to patient. Yes Patient provided with "ticket" for LDCT Scan. yes Written Order for Lung Cancer Screening with LDCT placed in Epic. Yes (CT Chest Lung Cancer Screening Low Dose W/O CM) [id number] Z12.2-Screening of respiratory organs Z87.891-Personal history of nicotine dependence  I have spent 25 minutes of face to face/ virtual visit   time with  Ashley Scott discussing the risks and benefits of lung cancer screening. We viewed / discussed a power point together that explained in detail the above noted topics. We paused at intervals to allow for questions to be asked and answered to ensure understanding.We discussed that the single most powerful action that she can take to decrease her risk of developing lung cancer is to quit smoking. We discussed whether or not she is ready to commit to setting a quit date. We discussed options for tools to aid in quitting smoking including nicotine replacement therapy, non-nicotine medications, support groups, Quit Smart classes, and behavior modification. We discussed that often times setting smaller, more achievable goals, such as eliminating 1 cigarette a day for a week and then 2 cigarettes a day for a week can be helpful in slowly decreasing the number of cigarettes smoked. This allows for a sense of accomplishment as well as providing a clinical benefit. I provided  her  with smoking cessation  information  with contact information for community resources, classes, free  nicotine replacement therapy, and  access to mobile apps, text messaging, and on-line smoking cessation help. I have also provided  her  the office contact information in the event she needs to contact me, or the screening staff. We discussed the time and location of the scan, and that either Doroteo Glassman RN, Joella Prince, RN  or I will call / send a letter with the results within 24-72 hours of receiving them. The patient verbalized understanding of all of  the above and had no further questions upon leaving the office. They have my contact information in the event they have any further questions.  I spent 3-4 minutes counseling on smoking cessation and the health risks of continued tobacco abuse.  I explained to the patient that there has been a high incidence of coronary artery disease noted on these exams. I explained that this is a non-gated exam therefore degree or severity cannot be determined. This patient is on statin therapy. I have asked the patient to follow-up with their PCP regarding any incidental finding of coronary artery disease and management with diet or medication as their PCP  feels is clinically indicated. The patient verbalized understanding of the above and had no further questions upon completion of the visit.      Magdalen Spatz, NP 05/10/2021

## 2021-05-10 NOTE — Patient Instructions (Signed)
Thank you for participating in the [facility]. °It was our pleasure to meet you today. °We will call you with the results of your scan within the next few days. °Your scan will be assigned a Lung RADS category score by the physicians reading the scans.  °This Lung RADS score determines follow up scanning.  °See below for description of categories, and follow up screening recommendations. °We will be in touch to schedule your follow up screening annually or based on recommendations of our providers. °We will fax a copy of your scan results to your Primary Care Physician, or the physician who referred you to the program, to ensure they have the results. °Please call the office if you have any questions or concerns regarding your scanning experience or results.  °Our office number is [phone]. °Please speak with Denise Phelps, RN. She is our Lung Cancer Screening RN. °If she is unavailable when you call, please have the office staff send her a message. She will return your call at her earliest convenience. °Remember, if your scan is normal, we will scan you annually as long as you continue to meet the criteria for the program. (Age 55-77, Current smoker or smoker who has quit within the last 15 years). °If you are a smoker, remember, quitting is the single most powerful action that you can take to decrease your risk of lung cancer and other pulmonary, breathing related problems. °We know quitting is hard, and we are here to help.  °Please let us know if there is anything we can do to help you meet your goal of quitting. °If you are a former smoker, congratulations. We are proud of you! Remain smoke free! °Remember you can refer friends or family members through the number above.  °We will screen them to make sure they meet criteria for the program. °Thank you for helping us take better care of you by participating in Lung Screening. ° °You can receive free nicotine replacement therapy  ( patches, gum or mints) by calling 1-800-QUIT NOW. Please call so we can get you on the path to becoming  a non-smoker. I know it is hard, but you can do this! ° °Lung RADS Categories: ° °Lung RADS 1: no nodules or definitely non-concerning nodules.  °Recommendation is for a repeat annual scan in 12 months. ° °Lung RADS 2:  nodules that are non-concerning in appearance and behavior with a very low likelihood of becoming an active cancer. °Recommendation is for a repeat annual scan in 12 months. ° °Lung RADS 3: nodules that are probably non-concerning , includes nodules with a low likelihood of becoming an active cancer.  Recommendation is for a 6-month repeat screening scan. Often noted after an upper respiratory illness. We will be in touch to make sure you have no questions, and to schedule your 6-month scan. ° °Lung RADS 4 A: nodules with concerning findings, recommendation is most often for a follow up scan in 3 months or additional testing based on our provider's assessment of the scan. We will be in touch to make sure you have no questions and to schedule the recommended 3 month follow up scan. ° °Lung RADS 4 B:  indicates findings that are concerning. We will be in touch with you to schedule additional diagnostic testing based on our provider's  assessment of the scan. ° °Hypnosis for smoking cessation  °[employer] °[phone] ° °Acupuncture for smoking cessation  °[facility] °[phone]  °

## 2021-05-11 ENCOUNTER — Other Ambulatory Visit: Payer: Medicare HMO

## 2021-05-11 ENCOUNTER — Other Ambulatory Visit: Payer: Self-pay | Admitting: Physician Assistant

## 2021-05-11 DIAGNOSIS — M25511 Pain in right shoulder: Secondary | ICD-10-CM

## 2021-05-11 DIAGNOSIS — R200 Anesthesia of skin: Secondary | ICD-10-CM

## 2021-05-11 DIAGNOSIS — W19XXXA Unspecified fall, initial encounter: Secondary | ICD-10-CM

## 2021-05-11 DIAGNOSIS — R202 Paresthesia of skin: Secondary | ICD-10-CM

## 2021-05-14 ENCOUNTER — Other Ambulatory Visit: Payer: Self-pay | Admitting: Physician Assistant

## 2021-05-14 DIAGNOSIS — J42 Unspecified chronic bronchitis: Secondary | ICD-10-CM

## 2021-05-18 ENCOUNTER — Other Ambulatory Visit: Payer: Self-pay

## 2021-05-18 ENCOUNTER — Ambulatory Visit (INDEPENDENT_AMBULATORY_CARE_PROVIDER_SITE_OTHER): Payer: Medicare HMO

## 2021-05-18 DIAGNOSIS — Z13820 Encounter for screening for osteoporosis: Secondary | ICD-10-CM | POA: Diagnosis not present

## 2021-05-18 DIAGNOSIS — Z780 Asymptomatic menopausal state: Secondary | ICD-10-CM | POA: Diagnosis not present

## 2021-05-18 DIAGNOSIS — R911 Solitary pulmonary nodule: Secondary | ICD-10-CM

## 2021-05-18 DIAGNOSIS — M85831 Other specified disorders of bone density and structure, right forearm: Secondary | ICD-10-CM | POA: Diagnosis not present

## 2021-05-18 DIAGNOSIS — I700 Atherosclerosis of aorta: Secondary | ICD-10-CM

## 2021-05-20 ENCOUNTER — Encounter: Payer: Self-pay | Admitting: Physician Assistant

## 2021-05-20 DIAGNOSIS — M8580 Other specified disorders of bone density and structure, unspecified site: Secondary | ICD-10-CM | POA: Insufficient documentation

## 2021-05-20 NOTE — Progress Notes (Signed)
You are osteopenic, low bone mass, make sure you are taking vitamin D 2000 units daily and 1200mg  of calcium a day to help bone strength. Exercising daily and stopping smoking can also help the progression into osteoporosis.

## 2021-05-23 ENCOUNTER — Other Ambulatory Visit: Payer: Self-pay

## 2021-05-23 ENCOUNTER — Telehealth: Payer: Self-pay | Admitting: Acute Care

## 2021-05-23 DIAGNOSIS — R911 Solitary pulmonary nodule: Secondary | ICD-10-CM | POA: Insufficient documentation

## 2021-05-23 DIAGNOSIS — Z87891 Personal history of nicotine dependence: Secondary | ICD-10-CM

## 2021-05-23 DIAGNOSIS — I700 Atherosclerosis of aorta: Secondary | ICD-10-CM | POA: Insufficient documentation

## 2021-05-23 NOTE — Telephone Encounter (Signed)
Returned call to patient to review results of LDCT.  Recommendation to repeat scan in 6 months due to new nodule in right lung.  The follow up scan is a precautionary measure to visualize the nodule sooner than the usual one year CT.    Patient states she has had a lot of congestion in lungs since having covid about two years ago.  She agrees with sooner follow up and had no further questions.  Order placed for 6 months CT.

## 2021-05-23 NOTE — Telephone Encounter (Signed)
I attempted to call the patient with the results of her low dose CT Chest. There was no answer at either her cell phone or her home phone. I have left a HIPPA compliant message requesting the patient call the office for the results of her scan.  Langley Gauss, this patient needs a 6 month follow up for a LR 3. Once we have contacted the patient we can fax results to her PCP.  Thanks so much

## 2021-05-23 NOTE — Telephone Encounter (Signed)
Results of CT and plan for 6 months follow up scan were faxed to PCP

## 2021-06-06 ENCOUNTER — Encounter: Payer: Self-pay | Admitting: Physician Assistant

## 2021-06-06 ENCOUNTER — Telehealth (INDEPENDENT_AMBULATORY_CARE_PROVIDER_SITE_OTHER): Payer: Medicare HMO | Admitting: Physician Assistant

## 2021-06-06 VITALS — Ht 65.0 in | Wt 206.0 lb

## 2021-06-06 DIAGNOSIS — I10 Essential (primary) hypertension: Secondary | ICD-10-CM

## 2021-06-06 DIAGNOSIS — I700 Atherosclerosis of aorta: Secondary | ICD-10-CM | POA: Diagnosis not present

## 2021-06-06 DIAGNOSIS — I2583 Coronary atherosclerosis due to lipid rich plaque: Secondary | ICD-10-CM | POA: Diagnosis not present

## 2021-06-06 DIAGNOSIS — F17200 Nicotine dependence, unspecified, uncomplicated: Secondary | ICD-10-CM

## 2021-06-06 DIAGNOSIS — Z8249 Family history of ischemic heart disease and other diseases of the circulatory system: Secondary | ICD-10-CM | POA: Insufficient documentation

## 2021-06-06 DIAGNOSIS — R911 Solitary pulmonary nodule: Secondary | ICD-10-CM | POA: Diagnosis not present

## 2021-06-06 DIAGNOSIS — M545 Low back pain, unspecified: Secondary | ICD-10-CM | POA: Diagnosis not present

## 2021-06-06 DIAGNOSIS — G8929 Other chronic pain: Secondary | ICD-10-CM | POA: Diagnosis not present

## 2021-06-06 DIAGNOSIS — R69 Illness, unspecified: Secondary | ICD-10-CM | POA: Diagnosis not present

## 2021-06-06 DIAGNOSIS — I2510 Atherosclerotic heart disease of native coronary artery without angina pectoris: Secondary | ICD-10-CM | POA: Insufficient documentation

## 2021-06-06 MED ORDER — DULOXETINE HCL 30 MG PO CPEP: 30.0000 mg | ORAL_CAPSULE | Freq: Every day | ORAL | 2 refills | Status: DC

## 2021-06-06 NOTE — Progress Notes (Signed)
..Virtual Visit via Telephone Note ? ?I connected with Ashley Scott on 06/06/21 at  1:40 PM EST by telephone and verified that I am speaking with the correct person using two identifiers. ? ?Location: ?Patient: home ?Provider: clinic ? ?Marland Kitchen.Participating in visit:  ?Patient: Ashley Scott  ?Provider: Iran Planas PA-C ?Provider in training: Lyndle Herrlich PA-S ?  ?I discussed the limitations, risks, security and privacy concerns of performing an evaluation and management service by telephone and the availability of in person appointments. I also discussed with the patient that there may be a patient responsible charge related to this service. The patient expressed understanding and agreed to proceed. ? ? ?History of Present Illness: ?Pt is a 66 yo female who would like to go over results of low dose lung CT screening done in February.  ? ?.. ?Active Ambulatory Problems  ?  Diagnosis Date Noted  ? COPD (chronic obstructive pulmonary disease) ([place]) 10/03/2017  ? Hypercholesteremia 10/03/2017  ? Adenomatous polyp 10/03/2017  ? Family history of heart failure 10/04/2017  ? SOB (shortness of breath) on exertion 10/04/2017  ? Current smoker 10/04/2017  ? Bilateral impacted cerumen 10/04/2017  ? Vitamin D insufficiency 10/04/2017  ? Essential hypertension 10/04/2017  ? Depression, recurrent ([place]) 10/04/2017  ? Seasonal allergies 10/04/2017  ? Obesity (BMI 30-39.9) 10/04/2017  ? Bilateral lower extremity edema 10/08/2017  ? Orthopnea 10/08/2017  ? Left leg pain 10/08/2017  ? Tricuspid regurgitation 10/16/2017  ? Class 2 obesity due to excess calories without serious comorbidity with body mass index (BMI) of 35.0 to 35.9 in adult 11/02/2017  ? OAB (overactive bladder) 11/02/2017  ? Elevated fasting glucose 02/04/2018  ? Pre-diabetes 08/06/2018  ? Chronic fatigue 06/13/2019  ? Cystitis 06/13/2019  ? Mononucleosis 06/17/2019  ? Orthostatic hypotension 10/13/2019  ? No energy 10/13/2019  ? Dizziness 10/13/2019  ? Emphysema lung ([place]) 10/13/2019   ? Chronic pain of left knee 11/08/2020  ? Dupuytren's contracture of left hand 11/17/2020  ? Trigger middle finger of left hand 11/17/2020  ? Epidermal cyst 11/17/2020  ? Chronic allergic rhinitis 11/17/2020  ? COPD exacerbation ([place]) 11/17/2020  ? 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 Osteoarthritis of right shoulder 03/21/2021  ? DDD (degenerative disc disease), cervical 03/21/2021  ? Hypoxia 04/22/2021  ? Chronic respiratory failure with hypoxia ([place]) 04/22/2021  ? Itching 04/22/2021  ? Osteopenia 05/20/2021  ? Right upper lobe pulmonary nodule 05/23/2021  ? Aortic atherosclerosis ([place]) 05/23/2021  ? Coronary artery disease due to lipid rich plaque 06/06/2021  ? Family history of MI (myocardial infarction) 06/06/2021  ? Chronic bilateral low back pain without sciatica 06/06/2021  ? ?Resolved Ambulatory Problems  ?  Diagnosis Date Noted  ? Fracture of radius, left, closed 03/15/2018  ? Wound of right leg 04/19/2018  ? Staphylococcal infection of skin 08/06/2018  ? ?Past Medical History:  ?Diagnosis Date  ? Anxiety   ? Asthma   ? Herpes   ? Hypertension   ? Seizure-like activity ([place])   ? ? ? ?  ?Observations/Objective: ?No acute distress ? ?.. ?Today's Vitals  ? 06/06/21 1319  ?Weight: 206 lb (93.4 kg)  ?Height: '5\' 5"'$  (1.651 m)  ? ?Body mass index is 34.28 kg/m?. ? ? ? ?Assessment and Plan: ? ?Marland Kitchen.Diagnoses and all orders for this visit: ? ?Right upper lobe pulmonary nodule ? ?Aortic atherosclerosis ([place]) ?-     Ambulatory referral to Cardiology ? ?Coronary artery disease due to lipid rich plaque ?-     Ambulatory referral to Cardiology ? ?Current  smoker ?-     Ambulatory referral to Cardiology ? ?Family history of MI (myocardial infarction) ? ?Primary hypertension ?-     Ambulatory referral to Cardiology ? ?Chronic bilateral low back pain without sciatica ?-     DULoxetine (CYMBALTA) 30 MG capsule; Take 1 capsule (30 mg total) by mouth daily. For chronic pain. ? ? ? ?IMPRESSION: ?1. Solid pulmonary nodule of the right  upper lobe measuring 4.5 mm, ?new when compared with October 15, 2019 prior. Lung-RADS 3, probably ?benign findings. Short-term follow-up in 6 months is recommended ?with repeat low-dose chest CT without contrast (please use the ?following order, CT CHEST LCS NODULE FOLLOW-UP W/O CM). S modifier ?for coronary artery calcifications. ?2. Coronary artery calcifications of the LAD, recommend ASCVD risk ?assessment. ?3. Aortic Atherosclerosis (ICD10-I70.0) and Emphysema (ICD10-J43.9). ? ?Failed chantix for smoking cessation ? ?Discussed right upper pulmonary nodule and gave reassurance.  ?Recheck with CT in August. ? ?Discussed atherosclerosis and CAD. Family hx of MI.  ?Will refer to cardiology for more risk assessment.  ?On crestor and ACE/HCTZ.  ?Follow Up Instructions: ? ?  ?I discussed the assessment and treatment plan with the patient. The patient was provided an opportunity to ask questions and all were answered. The patient agreed with the plan and demonstrated an understanding of the instructions. ?  ?The patient was advised to call back or seek an in-person evaluation if the symptoms worsen or if the condition fails to improve as anticipated. ? ?I provided 20 minutes of non-face-to-face time during this encounter. ? ? ?Iran Planas, PA-C ? ? ?  ?

## 2021-06-08 ENCOUNTER — Ambulatory Visit (INDEPENDENT_AMBULATORY_CARE_PROVIDER_SITE_OTHER): Payer: Medicare HMO | Admitting: Sports Medicine

## 2021-06-08 ENCOUNTER — Other Ambulatory Visit: Payer: Self-pay

## 2021-06-08 DIAGNOSIS — M5030 Other cervical disc degeneration, unspecified cervical region: Secondary | ICD-10-CM | POA: Diagnosis not present

## 2021-06-08 DIAGNOSIS — J432 Centrilobular emphysema: Secondary | ICD-10-CM | POA: Diagnosis not present

## 2021-06-08 MED ORDER — DOXYCYCLINE HYCLATE 100 MG PO TABS: 100.0000 mg | ORAL_TABLET | Freq: Two times a day (BID) | ORAL | 0 refills | Status: AC

## 2021-06-08 MED ORDER — PREDNISONE 50 MG PO TABS: ORAL_TABLET | ORAL | 0 refills | Status: DC

## 2021-06-08 NOTE — Assessment & Plan Note (Signed)
This is a very pleasant 66 year old female, she has chronic axial neck pain with radiation down the left arm in a C8 in the C7 distribution. ?Occasional right arm radicular symptoms as well. ?No progressive weakness. ?No trauma. ?She did have some x-rays done by her PCP back in December that showed significant mid cervical degenerative disc disease. ?We will treat her conservatively at first, 5 days of prednisone, home conditioning exercises for 6 weeks, return to see me and if not better we will do an MRI and then triazolam preprocedural anxiolysis for cervical epidural. ?

## 2021-06-08 NOTE — Progress Notes (Signed)
? ? ?  Procedures performed today:   ? ?None. ? ?Independent interpretation of notes and tests performed by another provider:  ? ?None. ? ?Brief History, Exam, Impression, and Recommendations:   ? ?DDD (degenerative disc disease), cervical ?This is a very pleasant 66 year old female, she has chronic axial neck pain with radiation down the left arm in a C8 in the C7 distribution. ?Occasional right arm radicular symptoms as well. ?No progressive weakness. ?No trauma. ?She did have some x-rays done by her PCP back in December that showed significant mid cervical degenerative disc disease. ?We will treat her conservatively at first, 5 days of prednisone, home conditioning exercises for 6 weeks, return to see me and if not better we will do an MRI and then triazolam preprocedural anxiolysis for cervical epidural. ? ?Emphysema lung ([place]) ?Anaia also has a history of emphysema, currently on Trelegy, occasional DuoNebs, Singulair. ?She is having some increasing shortness of breath today with coarse sounds and expiratory wheezing. ?She is a smoker. ?Prednisone will help, adding doxycycline as well, she desires that we hold off on a chest x-ray for now, follow this up with PCP. ? ? ? ?___________________________________________ ?Gwen Her. Dianah Field, M.D., ABFM., CAQSM. ?Primary Care and Sports Medicine ?[place] ? ?Adjunct Instructor of Family Medicine  ?[employer] ?

## 2021-06-08 NOTE — Assessment & Plan Note (Signed)
Ashley Scott also has a history of emphysema, currently on Trelegy, occasional DuoNebs, Singulair. ?She is having some increasing shortness of breath today with coarse sounds and expiratory wheezing. ?She is a smoker. ?Prednisone will help, adding doxycycline as well, she desires that we hold off on a chest x-ray for now, follow this up with PCP. ?

## 2021-06-11 ENCOUNTER — Other Ambulatory Visit: Payer: Self-pay | Admitting: Physician Assistant

## 2021-06-11 DIAGNOSIS — I10 Essential (primary) hypertension: Secondary | ICD-10-CM

## 2021-06-30 ENCOUNTER — Other Ambulatory Visit: Payer: Self-pay | Admitting: Physician Assistant

## 2021-06-30 DIAGNOSIS — N6320 Unspecified lump in the left breast, unspecified quadrant: Secondary | ICD-10-CM

## 2021-07-02 ENCOUNTER — Other Ambulatory Visit: Payer: Self-pay | Admitting: Physician Assistant

## 2021-07-02 DIAGNOSIS — M545 Low back pain, unspecified: Secondary | ICD-10-CM

## 2021-07-14 ENCOUNTER — Other Ambulatory Visit: Payer: Self-pay | Admitting: Physician Assistant

## 2021-07-14 DIAGNOSIS — M25511 Pain in right shoulder: Secondary | ICD-10-CM

## 2021-07-14 DIAGNOSIS — W19XXXA Unspecified fall, initial encounter: Secondary | ICD-10-CM

## 2021-07-14 DIAGNOSIS — R200 Anesthesia of skin: Secondary | ICD-10-CM

## 2021-07-20 ENCOUNTER — Ambulatory Visit (INDEPENDENT_AMBULATORY_CARE_PROVIDER_SITE_OTHER): Payer: Medicare HMO | Admitting: Sports Medicine

## 2021-07-20 DIAGNOSIS — M5030 Other cervical disc degeneration, unspecified cervical region: Secondary | ICD-10-CM | POA: Diagnosis not present

## 2021-07-20 DIAGNOSIS — R600 Localized edema: Secondary | ICD-10-CM

## 2021-07-20 NOTE — Progress Notes (Deleted)
Referring-Ashley Breeback PA-C Reason for referral-coronary artery disease  HPI: 66 year old female for evaluation of coronary artery disease at request of Iran Planas PA-C.  Echocardiogram July 2019 showed normal LV function, mild left ventricular hypertrophy.  Chest CT February 2023 showed pulmonary nodule and follow-up in 6 months recommended.  There was also note of coronary calcification in the LAD and aortic atherosclerosis.  Cardiology now asked to evaluate.  Current Outpatient Medications  Medication Sig Dispense Refill   albuterol (VENTOLIN HFA) 108 (90 Base) MCG/ACT inhaler INHALE 2 PUFFS BY MOUTH EVERY 6 HOURS AS NEEDED FOR WHEEZING 17 each 5   AMBULATORY NON FORMULARY MEDICATION shingrx 2 doses to prevent shingles. 2 application 0   AMBULATORY NON FORMULARY MEDICATION shingrx 2 doses to prevent shingles. 2 application 0   cyclobenzaprine (FLEXERIL) 5 MG tablet TAKE 1 TABLET BY MOUTH THREE TIMES A DAY AS NEEDED FOR MUSCLE SPASMS 30 tablet 0   diclofenac sodium (VOLTAREN) 1 % GEL Apply 4 g topically 4 (four) times daily. To affected joint. 100 g 1   DULoxetine (CYMBALTA) 30 MG capsule TAKE 1 CAPSULE (30 MG TOTAL) BY MOUTH DAILY. FOR CHRONIC PAIN. 90 capsule 0   Fluticasone-Umeclidin-Vilant (TRELEGY ELLIPTA) 100-62.5-25 MCG/ACT AEPB INHALE ONE PUFF INTO LUNGS EVERY DAY 180 each 1   ipratropium-albuterol (DUONEB) 0.5-2.5 (3) MG/3ML SOLN Take 3 mLs by nebulization every 4 (four) hours as needed. 360 mL 0   ketoconazole (NIZORAL) 2 % cream Apply 1 application topically daily. 60 g 1   losartan-hydrochlorothiazide (HYZAAR) 50-12.5 MG tablet TAKE 1 TABLET BY MOUTH EVERY DAY 90 tablet 1   meloxicam (MOBIC) 15 MG tablet TAKE 1 TABLET (15 MG TOTAL) BY MOUTH DAILY. 90 tablet 1   montelukast (SINGULAIR) 10 MG tablet Take 1 tablet (10 mg total) by mouth at bedtime. 90 tablet 3   predniSONE (DELTASONE) 50 MG tablet One tab PO daily for 5 days. 5 tablet 0   rosuvastatin (CRESTOR) 10 MG tablet  TAKE 1 TABLET BY MOUTH EVERY DAY 90 tablet 3   sertraline (ZOLOFT) 100 MG tablet Take 1 tablet (100 mg total) by mouth daily. 90 tablet 3   solifenacin (VESICARE) 5 MG tablet Take 1 tablet (5 mg total) by mouth daily. 90 tablet 1   traZODone (DESYREL) 50 MG tablet TAKE 0.5-1 TABLETS BY MOUTH AT BEDTIME AS NEEDED FOR SLEEP. 90 tablet 0   triamcinolone cream (KENALOG) 0.1 % Apply 1 application topically 2 (two) times daily. 80 g 0   No current facility-administered medications for this visit.    Allergies  Allergen Reactions   Oxycodone-Acetaminophen Other (See Comments)    Shaking uncontrolable   Bupropion     Seizures     Past Medical History:  Diagnosis Date   Anxiety    Asthma    COPD (chronic obstructive pulmonary disease) ([facility])    Herpes    Hypercholesteremia    Hypertension    Seizure-like activity ([facility])     Past Surgical History:  Procedure Laterality Date   PARTIAL HYSTERECTOMY     right wrist surgery     TONSILLECTOMY AND ADENOIDECTOMY     WISDOM TOOTH EXTRACTION      Social History   Socioeconomic History   Marital status: Divorced    Spouse name: Not on file   Number of children: Not on file   Years of education: Not on file   Highest education level: Not on file  Occupational History   Not on file  Tobacco  Use   Smoking status: Former    Packs/day: 1.00    Types: Cigarettes   Smokeless tobacco: Never  Substance and Sexual Activity   Alcohol use: Yes    Comment: very rarely   Drug use: Never   Sexual activity: Not Currently  Other Topics Concern   Not on file  Social History Narrative   Not on file   Social Determinants of Health   Financial Resource Strain: Not on file  Food Insecurity: Not on file  Transportation Needs: Not on file  Physical Activity: Not on file  Stress: Not on file  Social Connections: Not on file  Intimate Partner Violence: Not on file    Family History  Problem Relation Age of Onset   Stomach cancer Mother     Brain cancer Mother    Hypertension Mother    Hypercholesterolemia Mother    Hypercholesterolemia Father    Diabetes Father    Hypertension Father    Liver disease Sister    Heart attack Maternal Grandfather    Brain cancer Maternal Grandfather    Breast cancer Maternal Aunt     ROS: no fevers or chills, productive cough, hemoptysis, dysphasia, odynophagia, melena, hematochezia, dysuria, hematuria, rash, seizure activity, orthopnea, PND, pedal edema, claudication. Remaining systems are negative.  Physical Exam:   There were no vitals taken for this visit.  General:  Well developed/well nourished in NAD Skin warm/dry Patient not depressed No peripheral clubbing Back-normal HEENT-normal/normal eyelids Neck supple/normal carotid upstroke bilaterally; no bruits; no JVD; no thyromegaly chest - CTA/ normal expansion CV - RRR/normal S1 and S2; no murmurs, rubs or gallops;  PMI nondisplaced Abdomen -NT/ND, no HSM, no mass, + bowel sounds, no bruit 2+ femoral pulses, no bruits Ext-no edema, chords, 2+ DP Neuro-grossly nonfocal  ECG - personally reviewed  A/P  1 coronary artery disease-  2 aortic atherosclerosis-  3 hyperlipidemia-  4 hypertension-  5 tobacco abuse-patient counseled on discontinuing.  Kirk Ruths, MD

## 2021-07-20 NOTE — Assessment & Plan Note (Signed)
Abrasion has finally healed. ?

## 2021-07-20 NOTE — Assessment & Plan Note (Signed)
Pleasant 66 year old female, chronic axial neck pain, radiation down the left arm. ?X-rays showed multilevel DDD, she responded really well to prednisone, home conditioning, she can return to see me as needed. ?She does have some residual pain left trapezial, if she decides that she wants this treated we can certainly start with a trigger point injection followed by MRI and epidural if insufficient improvement. ?

## 2021-07-20 NOTE — Progress Notes (Signed)
? ? ?  Procedures performed today:   ? ?None. ? ?Independent interpretation of notes and tests performed by another provider:  ? ?None. ? ?Brief History, Exam, Impression, and Recommendations:   ? ?DDD (degenerative disc disease), cervical ?Ashley 66 year old Scott, chronic axial neck pain, radiation down the left arm. ?X-rays showed multilevel DDD, she responded really well to prednisone, home conditioning, she can return to see me as needed. ?She does have some residual pain left trapezial, if she decides that she wants this treated we can certainly start with a trigger point injection followed by MRI and epidural if insufficient improvement. ? ?Bilateral lower extremity edema ?Abrasion has finally healed. ? ? ? ?___________________________________________ ?Ashley Her. Dianah Field, M.D., ABFM., CAQSM. ?Primary Care and Sports Medicine ?[place] ? ?Adjunct Instructor of Family Medicine  ?[employer] ?

## 2021-07-25 ENCOUNTER — Ambulatory Visit: Payer: Medicare HMO | Admitting: Cardiology

## 2021-08-08 NOTE — Progress Notes (Signed)
Referring-Ashley Breeback PA-C Reason for referral-CAD  HPI: 66 year old female for evaluation of coronary artery disease at request of Iran Planas PA-C.  Echocardiogram July 2019 showed normal LV function, mild left ventricular hypertrophy.  She recently had a chest CT that showed a pulmonary nodule in the right upper lobe.  She was also noted to have coronary calcification in the LAD as well as aortic atherosclerosis and emphysema.  Cardiology asked to evaluate.  Patient does have dyspnea on exertion.  She denies orthopnea, PND, pedal edema or syncope.  She has an occasional pain in her chest when she feels stressed.  This typically last 5 minutes and resolves.  She does not have exertional chest pain.  The pain does not radiate.  Cardiology now asked to evaluate.  Current Outpatient Medications  Medication Sig Dispense Refill   albuterol (VENTOLIN HFA) 108 (90 Base) MCG/ACT inhaler INHALE 2 PUFFS BY MOUTH EVERY 6 HOURS AS NEEDED FOR WHEEZING 17 each 5   AMBULATORY NON FORMULARY MEDICATION shingrx 2 doses to prevent shingles. 2 application 0   AMBULATORY NON FORMULARY MEDICATION shingrx 2 doses to prevent shingles. 2 application 0   DULoxetine (CYMBALTA) 30 MG capsule TAKE 1 CAPSULE (30 MG TOTAL) BY MOUTH DAILY. FOR CHRONIC PAIN. 90 capsule 0   Fluticasone-Umeclidin-Vilant (TRELEGY ELLIPTA) 100-62.5-25 MCG/ACT AEPB INHALE ONE PUFF INTO LUNGS EVERY DAY 180 each 1   ipratropium-albuterol (DUONEB) 0.5-2.5 (3) MG/3ML SOLN Take 3 mLs by nebulization every 4 (four) hours as needed. 360 mL 0   ketoconazole (NIZORAL) 2 % cream Apply 1 application topically daily. 60 g 1   losartan-hydrochlorothiazide (HYZAAR) 50-12.5 MG tablet TAKE 1 TABLET BY MOUTH EVERY DAY 90 tablet 1   meloxicam (MOBIC) 15 MG tablet TAKE 1 TABLET (15 MG TOTAL) BY MOUTH DAILY. 90 tablet 1   montelukast (SINGULAIR) 10 MG tablet Take 1 tablet (10 mg total) by mouth at bedtime. 90 tablet 3   rosuvastatin (CRESTOR) 10 MG tablet TAKE  1 TABLET BY MOUTH EVERY DAY 90 tablet 3   sertraline (ZOLOFT) 100 MG tablet TAKE 1 TABLET BY MOUTH EVERY DAY 90 tablet 1   solifenacin (VESICARE) 5 MG tablet TAKE 1 TABLET (5 MG TOTAL) BY MOUTH DAILY. 90 tablet 1   traZODone (DESYREL) 50 MG tablet TAKE 0.5-1 TABLETS BY MOUTH AT BEDTIME AS NEEDED FOR SLEEP. 90 tablet 0   No current facility-administered medications for this visit.    Allergies  Allergen Reactions   Oxycodone-Acetaminophen Other (See Comments)    Shaking uncontrolable   Bupropion     Seizures     Past Medical History:  Diagnosis Date   Anxiety    Asthma    COPD (chronic obstructive pulmonary disease) (HCC)    Herpes    Hypercholesteremia    Hypertension    Murmur    Seizure-like activity (HCC)     Past Surgical History:  Procedure Laterality Date   PARTIAL HYSTERECTOMY     right wrist surgery     TONSILLECTOMY AND ADENOIDECTOMY     WISDOM TOOTH EXTRACTION      Social History   Socioeconomic History   Marital status: Divorced    Spouse name: Not on file   Number of children: 2   Years of education: Not on file   Highest education level: Not on file  Occupational History   Not on file  Tobacco Use   Smoking status: Every Day    Packs/day: 1.00    Types: Cigarettes  Smokeless tobacco: Never  Substance and Sexual Activity   Alcohol use: Yes    Comment: Rare   Drug use: Never   Sexual activity: Not Currently  Other Topics Concern   Not on file  Social History Narrative   Not on file   Social Determinants of Health   Financial Resource Strain: Not on file  Food Insecurity: Not on file  Transportation Needs: Not on file  Physical Activity: Not on file  Stress: Not on file  Social Connections: Not on file  Intimate Partner Violence: Not on file    Family History  Problem Relation Age of Onset   Stomach cancer Mother    Brain cancer Mother    Hypertension Mother    Hypercholesterolemia Mother    Heart attack Father     Hypercholesterolemia Father    Diabetes Father    Hypertension Father    Liver disease Sister    Heart attack Maternal Grandfather    Brain cancer Maternal Grandfather    Breast cancer Maternal Aunt     ROS: no fevers or chills, productive cough, hemoptysis, dysphasia, odynophagia, melena, hematochezia, dysuria, hematuria, rash, seizure activity, orthopnea, PND, pedal edema, claudication. Remaining systems are negative.  Physical Exam:   Blood pressure 118/86, pulse 76, height '5\' 5"'$  (1.651 m), weight 211 lb 0.6 oz (95.7 kg), SpO2 94 %.  General:  Well developed/well nourished in NAD Skin warm/dry Patient not depressed No peripheral clubbing Back-normal HEENT-normal/normal eyelids Neck supple/normal carotid upstroke bilaterally; no bruits; no JVD; no thyromegaly chest - CTA/ normal expansion CV - RRR/normal S1 and S2; no murmurs, rubs or gallops;  PMI nondisplaced Abdomen -NT/ND, no HSM, no mass, + bowel sounds, no bruit 2+ femoral pulses, no bruits Ext-no edema, chords, 2+ DP Neuro-grossly nonfocal  ECG -normal sinus rhythm at a rate of 76, RV conduction delay, no ST changes.  Personally reviewed  A/P  1 coronary artery disease-plan to treat with aspirin 81 mg daily.  Continue statin.  Patient does describe dyspnea on exertion likely secondary to COPD.  However she also has atypical chest pain.  I will arrange a PET scan to screen for ischemia.  2 hypertension-blood pressure controlled.  Continue present medications and follow.  3 hyperlipidemia-given documented coronary disease I will increase her Crestor from 10 to 40 mg daily.  Check lipids and liver in 8 weeks.  4 aortic atherosclerosis-we will treat with aspirin and statin as outlined above.  5 tobacco abuse-patient counseled on discontinuing.  Kirk Ruths, MD

## 2021-08-12 ENCOUNTER — Other Ambulatory Visit: Payer: Self-pay | Admitting: Physician Assistant

## 2021-08-12 DIAGNOSIS — N3281 Overactive bladder: Secondary | ICD-10-CM

## 2021-08-12 DIAGNOSIS — F339 Major depressive disorder, recurrent, unspecified: Secondary | ICD-10-CM

## 2021-08-22 ENCOUNTER — Encounter: Payer: Self-pay | Admitting: Cardiology

## 2021-08-22 ENCOUNTER — Ambulatory Visit: Payer: Medicare HMO | Admitting: Cardiology

## 2021-08-22 VITALS — BP 118/86 | HR 76 | Ht 65.0 in | Wt 211.0 lb

## 2021-08-22 DIAGNOSIS — R7303 Prediabetes: Secondary | ICD-10-CM | POA: Diagnosis not present

## 2021-08-22 DIAGNOSIS — R0609 Other forms of dyspnea: Secondary | ICD-10-CM

## 2021-08-22 DIAGNOSIS — E7800 Pure hypercholesterolemia, unspecified: Secondary | ICD-10-CM

## 2021-08-22 DIAGNOSIS — I10 Essential (primary) hypertension: Secondary | ICD-10-CM | POA: Diagnosis not present

## 2021-08-22 MED ORDER — ASPIRIN 81 MG PO TBEC: 81.0000 mg | DELAYED_RELEASE_TABLET | Freq: Every day | ORAL | 3 refills | Status: DC

## 2021-08-22 MED ORDER — ROSUVASTATIN CALCIUM 40 MG PO TABS: 40.0000 mg | ORAL_TABLET | Freq: Every day | ORAL | 3 refills | Status: DC

## 2021-08-22 NOTE — Patient Instructions (Signed)
Medication Instructions:   START ROSUVASTATIN 40 MG ONCE DAILY  *If you need a refill on your cardiac medications before your next appointment, please call your pharmacy*   Lab Work:  Your physician recommends that you return for lab work in: [place]  If you have labs (blood work) drawn today and your tests are completely normal, you will receive your results only by: [place] (if you have MyChart) OR A paper copy in the mail If you have any lab test that is abnormal or we need to change your treatment, we will call you to review the results.   Testing/Procedures:  How to Prepare for Your Cardiac PET/CT Stress Test:  1. Please do not take these medications before your test:   Medications that may interfere with the cardiac pharmacological stress agent (ex. nitrates or beta-blockers) the day of the exam. Theophylline containing medications for 12 hours. Dipyridamole 48 hours prior to the test. Your remaining medications may be taken with water.  2. Nothing to eat or drink, except water, 3 hours prior to arrival time.   NO caffeine/decaffeinated products, or chocolate 12 hours prior to arrival.  3. NO perfume, cologne or lotion  4. Total time is 1 to 2 hours; you may want to bring reading material for the waiting time.  5. Please report to Admitting at the [facility] Main Entrance 60 minutes early for your test.  [place], [place] [address]  Diabetic Preparation:  Hold oral medications. You may take NPH and Lantus insulin. Do not take Humalog or Humulin R (Regular Insulin) the day of your test. Check blood sugars prior to leaving the house. If able to eat breakfast prior to 3 hour fasting, you may take all medications, including your insulin, Do not worry if you miss your breakfast dose of insulin - start at your next meal.  IF YOU THINK YOU MAY BE PREGNANT, OR ARE NURSING PLEASE INFORM THE TECHNOLOGIST.  In preparation for your  appointment, medication and supplies will be purchased.  Appointment availability is limited, so if you need to cancel or reschedule, please call the Radiology Department at [phone]  24 hours in advance to avoid a cancellation fee of $100.00  What to Expect After you Arrive:  Once you arrive and check in for your appointment, you will be taken to a preparation room within the Radiology Department.  A technologist or Nurse will obtain your medical history, verify that you are correctly prepped for the exam, and explain the procedure.  Afterwards,  an IV will be started in your arm and electrodes will be placed on your skin for EKG monitoring during the stress portion of the exam. Then you will be escorted to the PET/CT scanner.  There, staff will get you positioned on the scanner and obtain a blood pressure and EKG.  During the exam, you will continue to be connected to the EKG and blood pressure machines.  A small, safe amount of a radioactive tracer will be injected in your IV to obtain a series of pictures of your heart along with an injection of a stress agent.    After your Exam:  It is recommended that you eat a meal and drink a caffeinated beverage to counter act any effects of the stress agent.  Drink plenty of fluids for the remainder of the day and urinate frequently for the first couple of hours after the exam.  Your doctor will inform you of your test  results within 7-10 business days.  For questions about your test or how to prepare for your test, please call: Marchia Bond, Cardiac Imaging Nurse Navigator  Gordy Clement, Cardiac Imaging Nurse Navigator Office: [phone]    Follow-Up: At [facility], you and your health needs are our priority.  As part of our continuing mission to provide you with exceptional heart care, we have created designated Provider Care Teams.  These Care Teams include your primary Cardiologist (physician) and Advanced Practice Providers (APPs -   Physician Assistants and Nurse Practitioners) who all work together to provide you with the care you need, when you need it.  We recommend signing up for the patient portal called "MyChart".  Sign up information is provided on this After Visit Summary.  MyChart is used to connect with patients for Virtual Visits (Telemedicine).  Patients are able to view lab/test results, encounter notes, upcoming appointments, etc.  Non-urgent messages can be sent to your provider as well.   To learn more about what you can do with MyChart, go to [email or web address].    Your next appointment:   6 month(s)  The format for your next appointment:   In Person  Provider:   Kirk Ruths, MD      Important Information About Sugar

## 2021-09-24 DIAGNOSIS — F17200 Nicotine dependence, unspecified, uncomplicated: Secondary | ICD-10-CM | POA: Diagnosis not present

## 2021-09-24 DIAGNOSIS — K668 Other specified disorders of peritoneum: Secondary | ICD-10-CM | POA: Diagnosis not present

## 2021-09-24 DIAGNOSIS — N3000 Acute cystitis without hematuria: Secondary | ICD-10-CM | POA: Diagnosis not present

## 2021-09-24 DIAGNOSIS — R109 Unspecified abdominal pain: Secondary | ICD-10-CM | POA: Diagnosis not present

## 2021-09-24 DIAGNOSIS — E872 Acidosis, unspecified: Secondary | ICD-10-CM | POA: Diagnosis not present

## 2021-09-24 DIAGNOSIS — J90 Pleural effusion, not elsewhere classified: Secondary | ICD-10-CM | POA: Diagnosis not present

## 2021-09-24 DIAGNOSIS — K255 Chronic or unspecified gastric ulcer with perforation: Secondary | ICD-10-CM | POA: Diagnosis not present

## 2021-09-24 DIAGNOSIS — R55 Syncope and collapse: Secondary | ICD-10-CM | POA: Diagnosis not present

## 2021-09-24 DIAGNOSIS — I2583 Coronary atherosclerosis due to lipid rich plaque: Secondary | ICD-10-CM | POA: Diagnosis not present

## 2021-09-24 DIAGNOSIS — J439 Emphysema, unspecified: Secondary | ICD-10-CM | POA: Diagnosis not present

## 2021-09-24 DIAGNOSIS — R1909 Other intra-abdominal and pelvic swelling, mass and lump: Secondary | ICD-10-CM | POA: Diagnosis not present

## 2021-09-24 DIAGNOSIS — R231 Pallor: Secondary | ICD-10-CM | POA: Diagnosis not present

## 2021-09-24 DIAGNOSIS — I2510 Atherosclerotic heart disease of native coronary artery without angina pectoris: Secondary | ICD-10-CM | POA: Diagnosis not present

## 2021-09-24 DIAGNOSIS — R6521 Severe sepsis with septic shock: Secondary | ICD-10-CM | POA: Diagnosis not present

## 2021-09-24 DIAGNOSIS — J9811 Atelectasis: Secondary | ICD-10-CM | POA: Diagnosis not present

## 2021-09-24 DIAGNOSIS — R69 Illness, unspecified: Secondary | ICD-10-CM | POA: Diagnosis not present

## 2021-09-24 DIAGNOSIS — Z9911 Dependence on respirator [ventilator] status: Secondary | ICD-10-CM | POA: Diagnosis not present

## 2021-09-24 DIAGNOSIS — M545 Low back pain, unspecified: Secondary | ICD-10-CM | POA: Diagnosis not present

## 2021-09-24 DIAGNOSIS — R932 Abnormal findings on diagnostic imaging of liver and biliary tract: Secondary | ICD-10-CM | POA: Diagnosis not present

## 2021-09-24 DIAGNOSIS — R600 Localized edema: Secondary | ICD-10-CM | POA: Diagnosis not present

## 2021-09-24 DIAGNOSIS — R5382 Chronic fatigue, unspecified: Secondary | ICD-10-CM | POA: Diagnosis not present

## 2021-09-24 DIAGNOSIS — J449 Chronic obstructive pulmonary disease, unspecified: Secondary | ICD-10-CM | POA: Diagnosis not present

## 2021-09-24 DIAGNOSIS — I959 Hypotension, unspecified: Secondary | ICD-10-CM | POA: Diagnosis not present

## 2021-09-24 DIAGNOSIS — G40909 Epilepsy, unspecified, not intractable, without status epilepticus: Secondary | ICD-10-CM | POA: Diagnosis not present

## 2021-09-24 DIAGNOSIS — K659 Peritonitis, unspecified: Secondary | ICD-10-CM | POA: Diagnosis not present

## 2021-09-24 DIAGNOSIS — Z515 Encounter for palliative care: Secondary | ICD-10-CM | POA: Diagnosis not present

## 2021-09-24 DIAGNOSIS — F3340 Major depressive disorder, recurrent, in remission, unspecified: Secondary | ICD-10-CM | POA: Diagnosis not present

## 2021-09-24 DIAGNOSIS — Z452 Encounter for adjustment and management of vascular access device: Secondary | ICD-10-CM | POA: Diagnosis not present

## 2021-09-24 DIAGNOSIS — I081 Rheumatic disorders of both mitral and tricuspid valves: Secondary | ICD-10-CM | POA: Diagnosis not present

## 2021-09-24 DIAGNOSIS — N170 Acute kidney failure with tubular necrosis: Secondary | ICD-10-CM | POA: Diagnosis not present

## 2021-09-24 DIAGNOSIS — J432 Centrilobular emphysema: Secondary | ICD-10-CM | POA: Diagnosis not present

## 2021-09-24 DIAGNOSIS — J984 Other disorders of lung: Secondary | ICD-10-CM | POA: Diagnosis not present

## 2021-09-24 DIAGNOSIS — N179 Acute kidney failure, unspecified: Secondary | ICD-10-CM | POA: Diagnosis not present

## 2021-09-24 DIAGNOSIS — R569 Unspecified convulsions: Secondary | ICD-10-CM | POA: Diagnosis not present

## 2021-09-24 DIAGNOSIS — K828 Other specified diseases of gallbladder: Secondary | ICD-10-CM | POA: Diagnosis not present

## 2021-09-24 DIAGNOSIS — K5730 Diverticulosis of large intestine without perforation or abscess without bleeding: Secondary | ICD-10-CM | POA: Diagnosis not present

## 2021-09-24 DIAGNOSIS — R579 Shock, unspecified: Secondary | ICD-10-CM | POA: Diagnosis not present

## 2021-09-24 DIAGNOSIS — E875 Hyperkalemia: Secondary | ICD-10-CM | POA: Diagnosis not present

## 2021-09-24 DIAGNOSIS — D751 Secondary polycythemia: Secondary | ICD-10-CM | POA: Diagnosis not present

## 2021-09-24 DIAGNOSIS — N3080 Other cystitis without hematuria: Secondary | ICD-10-CM | POA: Diagnosis not present

## 2021-09-24 DIAGNOSIS — K219 Gastro-esophageal reflux disease without esophagitis: Secondary | ICD-10-CM | POA: Diagnosis not present

## 2021-09-24 DIAGNOSIS — N200 Calculus of kidney: Secondary | ICD-10-CM | POA: Diagnosis not present

## 2021-09-24 DIAGNOSIS — J9621 Acute and chronic respiratory failure with hypoxia: Secondary | ICD-10-CM | POA: Diagnosis not present

## 2021-09-24 DIAGNOSIS — J189 Pneumonia, unspecified organism: Secondary | ICD-10-CM | POA: Diagnosis not present

## 2021-09-24 DIAGNOSIS — J168 Pneumonia due to other specified infectious organisms: Secondary | ICD-10-CM | POA: Diagnosis not present

## 2021-09-24 DIAGNOSIS — R918 Other nonspecific abnormal finding of lung field: Secondary | ICD-10-CM | POA: Diagnosis not present

## 2021-09-24 DIAGNOSIS — N289 Disorder of kidney and ureter, unspecified: Secondary | ICD-10-CM | POA: Diagnosis not present

## 2021-09-24 DIAGNOSIS — A4151 Sepsis due to Escherichia coli [E. coli]: Secondary | ICD-10-CM | POA: Diagnosis not present

## 2021-09-24 DIAGNOSIS — K6389 Other specified diseases of intestine: Secondary | ICD-10-CM | POA: Diagnosis not present

## 2021-09-24 DIAGNOSIS — A4189 Other specified sepsis: Secondary | ICD-10-CM | POA: Diagnosis not present

## 2021-09-24 DIAGNOSIS — G8929 Other chronic pain: Secondary | ICD-10-CM | POA: Diagnosis not present

## 2021-09-24 DIAGNOSIS — Z1159 Encounter for screening for other viral diseases: Secondary | ICD-10-CM | POA: Diagnosis not present

## 2021-09-24 DIAGNOSIS — Y828 Other medical devices associated with adverse incidents: Secondary | ICD-10-CM | POA: Diagnosis not present

## 2021-09-24 DIAGNOSIS — M1711 Unilateral primary osteoarthritis, right knee: Secondary | ICD-10-CM | POA: Diagnosis not present

## 2021-09-24 DIAGNOSIS — A419 Sepsis, unspecified organism: Secondary | ICD-10-CM | POA: Diagnosis not present

## 2021-09-24 DIAGNOSIS — I3139 Other pericardial effusion (noninflammatory): Secondary | ICD-10-CM | POA: Diagnosis not present

## 2021-09-24 DIAGNOSIS — R188 Other ascites: Secondary | ICD-10-CM | POA: Diagnosis not present

## 2021-09-24 DIAGNOSIS — Z6839 Body mass index (BMI) 39.0-39.9, adult: Secondary | ICD-10-CM | POA: Diagnosis not present

## 2021-09-24 DIAGNOSIS — R911 Solitary pulmonary nodule: Secondary | ICD-10-CM | POA: Diagnosis not present

## 2021-09-24 DIAGNOSIS — I10 Essential (primary) hypertension: Secondary | ICD-10-CM | POA: Diagnosis not present

## 2021-09-24 DIAGNOSIS — K251 Acute gastric ulcer with perforation: Secondary | ICD-10-CM | POA: Diagnosis not present

## 2021-09-24 DIAGNOSIS — Z66 Do not resuscitate: Secondary | ICD-10-CM | POA: Diagnosis not present

## 2021-09-24 DIAGNOSIS — R509 Fever, unspecified: Secondary | ICD-10-CM | POA: Diagnosis not present

## 2021-09-24 DIAGNOSIS — Z4682 Encounter for fitting and adjustment of non-vascular catheter: Secondary | ICD-10-CM | POA: Diagnosis not present

## 2021-09-24 DIAGNOSIS — T85638A Leakage of other specified internal prosthetic devices, implants and grafts, initial encounter: Secondary | ICD-10-CM | POA: Diagnosis not present

## 2021-09-24 DIAGNOSIS — E871 Hypo-osmolality and hyponatremia: Secondary | ICD-10-CM | POA: Diagnosis not present

## 2021-09-24 DIAGNOSIS — E669 Obesity, unspecified: Secondary | ICD-10-CM | POA: Diagnosis not present

## 2021-09-24 DIAGNOSIS — M5442 Lumbago with sciatica, left side: Secondary | ICD-10-CM | POA: Diagnosis not present

## 2021-09-24 DIAGNOSIS — I358 Other nonrheumatic aortic valve disorders: Secondary | ICD-10-CM | POA: Diagnosis not present

## 2021-09-24 DIAGNOSIS — Z90710 Acquired absence of both cervix and uterus: Secondary | ICD-10-CM | POA: Diagnosis not present

## 2021-09-24 DIAGNOSIS — N3281 Overactive bladder: Secondary | ICD-10-CM | POA: Diagnosis not present

## 2021-09-25 DIAGNOSIS — N3080 Other cystitis without hematuria: Secondary | ICD-10-CM | POA: Diagnosis not present

## 2021-09-25 DIAGNOSIS — J9621 Acute and chronic respiratory failure with hypoxia: Secondary | ICD-10-CM | POA: Diagnosis not present

## 2021-09-25 DIAGNOSIS — K219 Gastro-esophageal reflux disease without esophagitis: Secondary | ICD-10-CM | POA: Diagnosis not present

## 2021-09-25 DIAGNOSIS — J189 Pneumonia, unspecified organism: Secondary | ICD-10-CM | POA: Diagnosis not present

## 2021-09-25 DIAGNOSIS — N3000 Acute cystitis without hematuria: Secondary | ICD-10-CM | POA: Diagnosis not present

## 2021-09-25 DIAGNOSIS — J90 Pleural effusion, not elsewhere classified: Secondary | ICD-10-CM | POA: Diagnosis not present

## 2021-09-25 DIAGNOSIS — R918 Other nonspecific abnormal finding of lung field: Secondary | ICD-10-CM | POA: Diagnosis not present

## 2021-09-25 DIAGNOSIS — R109 Unspecified abdominal pain: Secondary | ICD-10-CM | POA: Diagnosis not present

## 2021-09-25 DIAGNOSIS — I10 Essential (primary) hypertension: Secondary | ICD-10-CM | POA: Diagnosis not present

## 2021-09-25 DIAGNOSIS — A419 Sepsis, unspecified organism: Secondary | ICD-10-CM | POA: Diagnosis not present

## 2021-09-25 DIAGNOSIS — E871 Hypo-osmolality and hyponatremia: Secondary | ICD-10-CM | POA: Diagnosis not present

## 2021-09-25 DIAGNOSIS — N170 Acute kidney failure with tubular necrosis: Secondary | ICD-10-CM | POA: Diagnosis not present

## 2021-09-25 DIAGNOSIS — G40909 Epilepsy, unspecified, not intractable, without status epilepticus: Secondary | ICD-10-CM | POA: Diagnosis not present

## 2021-09-25 DIAGNOSIS — Z452 Encounter for adjustment and management of vascular access device: Secondary | ICD-10-CM | POA: Diagnosis not present

## 2021-09-25 DIAGNOSIS — Z4682 Encounter for fitting and adjustment of non-vascular catheter: Secondary | ICD-10-CM | POA: Diagnosis not present

## 2021-09-25 DIAGNOSIS — R579 Shock, unspecified: Secondary | ICD-10-CM | POA: Diagnosis not present

## 2021-09-25 DIAGNOSIS — R6521 Severe sepsis with septic shock: Secondary | ICD-10-CM | POA: Diagnosis not present

## 2021-09-25 DIAGNOSIS — K251 Acute gastric ulcer with perforation: Secondary | ICD-10-CM | POA: Diagnosis not present

## 2021-09-25 DIAGNOSIS — K668 Other specified disorders of peritoneum: Secondary | ICD-10-CM | POA: Diagnosis not present

## 2021-09-25 DIAGNOSIS — R69 Illness, unspecified: Secondary | ICD-10-CM | POA: Diagnosis not present

## 2021-09-25 DIAGNOSIS — E872 Acidosis, unspecified: Secondary | ICD-10-CM | POA: Diagnosis not present

## 2021-09-25 DIAGNOSIS — E875 Hyperkalemia: Secondary | ICD-10-CM | POA: Diagnosis not present

## 2021-09-25 DIAGNOSIS — K255 Chronic or unspecified gastric ulcer with perforation: Secondary | ICD-10-CM | POA: Diagnosis not present

## 2021-09-25 DIAGNOSIS — J449 Chronic obstructive pulmonary disease, unspecified: Secondary | ICD-10-CM | POA: Diagnosis not present

## 2021-09-25 DIAGNOSIS — J984 Other disorders of lung: Secondary | ICD-10-CM | POA: Diagnosis not present

## 2021-09-25 DIAGNOSIS — J432 Centrilobular emphysema: Secondary | ICD-10-CM | POA: Diagnosis not present

## 2021-09-25 DIAGNOSIS — E669 Obesity, unspecified: Secondary | ICD-10-CM | POA: Diagnosis not present

## 2021-09-25 DIAGNOSIS — N179 Acute kidney failure, unspecified: Secondary | ICD-10-CM | POA: Diagnosis not present

## 2021-09-25 DIAGNOSIS — I3139 Other pericardial effusion (noninflammatory): Secondary | ICD-10-CM | POA: Diagnosis not present

## 2021-09-26 DIAGNOSIS — E871 Hypo-osmolality and hyponatremia: Secondary | ICD-10-CM | POA: Diagnosis not present

## 2021-09-26 DIAGNOSIS — A419 Sepsis, unspecified organism: Secondary | ICD-10-CM | POA: Diagnosis not present

## 2021-09-26 DIAGNOSIS — J9621 Acute and chronic respiratory failure with hypoxia: Secondary | ICD-10-CM | POA: Diagnosis not present

## 2021-09-26 DIAGNOSIS — R569 Unspecified convulsions: Secondary | ICD-10-CM | POA: Diagnosis not present

## 2021-09-26 DIAGNOSIS — N170 Acute kidney failure with tubular necrosis: Secondary | ICD-10-CM | POA: Diagnosis not present

## 2021-09-26 DIAGNOSIS — R579 Shock, unspecified: Secondary | ICD-10-CM | POA: Diagnosis not present

## 2021-09-26 DIAGNOSIS — R6521 Severe sepsis with septic shock: Secondary | ICD-10-CM | POA: Diagnosis not present

## 2021-09-26 DIAGNOSIS — E872 Acidosis, unspecified: Secondary | ICD-10-CM | POA: Diagnosis not present

## 2021-09-26 DIAGNOSIS — J189 Pneumonia, unspecified organism: Secondary | ICD-10-CM | POA: Diagnosis not present

## 2021-09-26 DIAGNOSIS — J449 Chronic obstructive pulmonary disease, unspecified: Secondary | ICD-10-CM | POA: Diagnosis not present

## 2021-09-26 DIAGNOSIS — I358 Other nonrheumatic aortic valve disorders: Secondary | ICD-10-CM | POA: Diagnosis not present

## 2021-09-26 DIAGNOSIS — E875 Hyperkalemia: Secondary | ICD-10-CM | POA: Diagnosis not present

## 2021-09-26 DIAGNOSIS — N3000 Acute cystitis without hematuria: Secondary | ICD-10-CM | POA: Diagnosis not present

## 2021-09-27 DIAGNOSIS — F411 Generalized anxiety disorder: Secondary | ICD-10-CM | POA: Diagnosis not present

## 2021-09-27 DIAGNOSIS — Z452 Encounter for adjustment and management of vascular access device: Secondary | ICD-10-CM | POA: Diagnosis not present

## 2021-09-27 DIAGNOSIS — K251 Acute gastric ulcer with perforation: Secondary | ICD-10-CM | POA: Diagnosis not present

## 2021-09-27 DIAGNOSIS — R69 Illness, unspecified: Secondary | ICD-10-CM | POA: Diagnosis not present

## 2021-09-27 DIAGNOSIS — J449 Chronic obstructive pulmonary disease, unspecified: Secondary | ICD-10-CM | POA: Diagnosis not present

## 2021-09-27 DIAGNOSIS — I2583 Coronary atherosclerosis due to lipid rich plaque: Secondary | ICD-10-CM | POA: Diagnosis not present

## 2021-09-27 DIAGNOSIS — J90 Pleural effusion, not elsewhere classified: Secondary | ICD-10-CM | POA: Diagnosis not present

## 2021-09-27 DIAGNOSIS — R6521 Severe sepsis with septic shock: Secondary | ICD-10-CM | POA: Diagnosis not present

## 2021-09-27 DIAGNOSIS — N170 Acute kidney failure with tubular necrosis: Secondary | ICD-10-CM | POA: Diagnosis not present

## 2021-09-27 DIAGNOSIS — M1711 Unilateral primary osteoarthritis, right knee: Secondary | ICD-10-CM | POA: Diagnosis not present

## 2021-09-27 DIAGNOSIS — Z4682 Encounter for fitting and adjustment of non-vascular catheter: Secondary | ICD-10-CM | POA: Diagnosis not present

## 2021-09-27 DIAGNOSIS — E875 Hyperkalemia: Secondary | ICD-10-CM | POA: Diagnosis not present

## 2021-09-27 DIAGNOSIS — N3000 Acute cystitis without hematuria: Secondary | ICD-10-CM | POA: Diagnosis not present

## 2021-09-27 DIAGNOSIS — F17210 Nicotine dependence, cigarettes, uncomplicated: Secondary | ICD-10-CM | POA: Diagnosis not present

## 2021-09-27 DIAGNOSIS — N179 Acute kidney failure, unspecified: Secondary | ICD-10-CM | POA: Diagnosis not present

## 2021-09-27 DIAGNOSIS — E872 Acidosis, unspecified: Secondary | ICD-10-CM | POA: Diagnosis not present

## 2021-09-27 DIAGNOSIS — J9621 Acute and chronic respiratory failure with hypoxia: Secondary | ICD-10-CM | POA: Diagnosis not present

## 2021-09-27 DIAGNOSIS — R579 Shock, unspecified: Secondary | ICD-10-CM | POA: Diagnosis not present

## 2021-09-27 DIAGNOSIS — F339 Major depressive disorder, recurrent, unspecified: Secondary | ICD-10-CM | POA: Diagnosis not present

## 2021-09-27 DIAGNOSIS — J189 Pneumonia, unspecified organism: Secondary | ICD-10-CM | POA: Diagnosis not present

## 2021-09-27 DIAGNOSIS — A419 Sepsis, unspecified organism: Secondary | ICD-10-CM | POA: Diagnosis not present

## 2021-09-27 DIAGNOSIS — E871 Hypo-osmolality and hyponatremia: Secondary | ICD-10-CM | POA: Diagnosis not present

## 2021-09-28 DIAGNOSIS — N3080 Other cystitis without hematuria: Secondary | ICD-10-CM | POA: Diagnosis not present

## 2021-09-28 DIAGNOSIS — R6521 Severe sepsis with septic shock: Secondary | ICD-10-CM | POA: Diagnosis not present

## 2021-09-28 DIAGNOSIS — N3000 Acute cystitis without hematuria: Secondary | ICD-10-CM | POA: Diagnosis not present

## 2021-09-28 DIAGNOSIS — J9621 Acute and chronic respiratory failure with hypoxia: Secondary | ICD-10-CM | POA: Diagnosis not present

## 2021-09-28 DIAGNOSIS — E875 Hyperkalemia: Secondary | ICD-10-CM | POA: Diagnosis not present

## 2021-09-28 DIAGNOSIS — J449 Chronic obstructive pulmonary disease, unspecified: Secondary | ICD-10-CM | POA: Diagnosis not present

## 2021-09-28 DIAGNOSIS — I2510 Atherosclerotic heart disease of native coronary artery without angina pectoris: Secondary | ICD-10-CM | POA: Diagnosis not present

## 2021-09-28 DIAGNOSIS — R579 Shock, unspecified: Secondary | ICD-10-CM | POA: Diagnosis not present

## 2021-09-28 DIAGNOSIS — J189 Pneumonia, unspecified organism: Secondary | ICD-10-CM | POA: Diagnosis not present

## 2021-09-28 DIAGNOSIS — N179 Acute kidney failure, unspecified: Secondary | ICD-10-CM | POA: Diagnosis not present

## 2021-09-28 DIAGNOSIS — E872 Acidosis, unspecified: Secondary | ICD-10-CM | POA: Diagnosis not present

## 2021-09-28 DIAGNOSIS — N170 Acute kidney failure with tubular necrosis: Secondary | ICD-10-CM | POA: Diagnosis not present

## 2021-09-28 DIAGNOSIS — I2583 Coronary atherosclerosis due to lipid rich plaque: Secondary | ICD-10-CM | POA: Diagnosis not present

## 2021-09-28 DIAGNOSIS — K251 Acute gastric ulcer with perforation: Secondary | ICD-10-CM | POA: Diagnosis not present

## 2021-09-28 DIAGNOSIS — E871 Hypo-osmolality and hyponatremia: Secondary | ICD-10-CM | POA: Diagnosis not present

## 2021-09-28 DIAGNOSIS — A419 Sepsis, unspecified organism: Secondary | ICD-10-CM | POA: Diagnosis not present

## 2021-09-28 DIAGNOSIS — Z9911 Dependence on respirator [ventilator] status: Secondary | ICD-10-CM | POA: Diagnosis not present

## 2021-09-28 DIAGNOSIS — F17200 Nicotine dependence, unspecified, uncomplicated: Secondary | ICD-10-CM | POA: Diagnosis not present

## 2021-09-28 DIAGNOSIS — Z1159 Encounter for screening for other viral diseases: Secondary | ICD-10-CM | POA: Diagnosis not present

## 2021-09-28 DIAGNOSIS — F339 Major depressive disorder, recurrent, unspecified: Secondary | ICD-10-CM | POA: Diagnosis not present

## 2021-09-28 DIAGNOSIS — R69 Illness, unspecified: Secondary | ICD-10-CM | POA: Diagnosis not present

## 2021-09-29 DIAGNOSIS — Z9911 Dependence on respirator [ventilator] status: Secondary | ICD-10-CM | POA: Diagnosis not present

## 2021-09-29 DIAGNOSIS — N179 Acute kidney failure, unspecified: Secondary | ICD-10-CM | POA: Diagnosis not present

## 2021-09-29 DIAGNOSIS — I2510 Atherosclerotic heart disease of native coronary artery without angina pectoris: Secondary | ICD-10-CM | POA: Diagnosis not present

## 2021-09-29 DIAGNOSIS — J189 Pneumonia, unspecified organism: Secondary | ICD-10-CM | POA: Diagnosis not present

## 2021-09-29 DIAGNOSIS — E872 Acidosis, unspecified: Secondary | ICD-10-CM | POA: Diagnosis not present

## 2021-09-29 DIAGNOSIS — F339 Major depressive disorder, recurrent, unspecified: Secondary | ICD-10-CM | POA: Diagnosis not present

## 2021-09-29 DIAGNOSIS — Z1159 Encounter for screening for other viral diseases: Secondary | ICD-10-CM | POA: Diagnosis not present

## 2021-09-29 DIAGNOSIS — R6521 Severe sepsis with septic shock: Secondary | ICD-10-CM | POA: Diagnosis not present

## 2021-09-29 DIAGNOSIS — N170 Acute kidney failure with tubular necrosis: Secondary | ICD-10-CM | POA: Diagnosis not present

## 2021-09-29 DIAGNOSIS — F17200 Nicotine dependence, unspecified, uncomplicated: Secondary | ICD-10-CM | POA: Diagnosis not present

## 2021-09-29 DIAGNOSIS — A419 Sepsis, unspecified organism: Secondary | ICD-10-CM | POA: Diagnosis not present

## 2021-09-29 DIAGNOSIS — I2583 Coronary atherosclerosis due to lipid rich plaque: Secondary | ICD-10-CM | POA: Diagnosis not present

## 2021-09-29 DIAGNOSIS — E875 Hyperkalemia: Secondary | ICD-10-CM | POA: Diagnosis not present

## 2021-09-29 DIAGNOSIS — J9621 Acute and chronic respiratory failure with hypoxia: Secondary | ICD-10-CM | POA: Diagnosis not present

## 2021-09-29 DIAGNOSIS — K251 Acute gastric ulcer with perforation: Secondary | ICD-10-CM | POA: Diagnosis not present

## 2021-09-29 DIAGNOSIS — E871 Hypo-osmolality and hyponatremia: Secondary | ICD-10-CM | POA: Diagnosis not present

## 2021-09-29 DIAGNOSIS — N3080 Other cystitis without hematuria: Secondary | ICD-10-CM | POA: Diagnosis not present

## 2021-09-29 DIAGNOSIS — N3000 Acute cystitis without hematuria: Secondary | ICD-10-CM | POA: Diagnosis not present

## 2021-09-29 DIAGNOSIS — R69 Illness, unspecified: Secondary | ICD-10-CM | POA: Diagnosis not present

## 2021-09-29 DIAGNOSIS — J449 Chronic obstructive pulmonary disease, unspecified: Secondary | ICD-10-CM | POA: Diagnosis not present

## 2021-09-29 DIAGNOSIS — R579 Shock, unspecified: Secondary | ICD-10-CM | POA: Diagnosis not present

## 2021-09-30 DIAGNOSIS — Z9911 Dependence on respirator [ventilator] status: Secondary | ICD-10-CM | POA: Diagnosis not present

## 2021-09-30 DIAGNOSIS — D751 Secondary polycythemia: Secondary | ICD-10-CM | POA: Diagnosis not present

## 2021-09-30 DIAGNOSIS — F3340 Major depressive disorder, recurrent, in remission, unspecified: Secondary | ICD-10-CM | POA: Diagnosis not present

## 2021-09-30 DIAGNOSIS — J449 Chronic obstructive pulmonary disease, unspecified: Secondary | ICD-10-CM | POA: Diagnosis not present

## 2021-09-30 DIAGNOSIS — E871 Hypo-osmolality and hyponatremia: Secondary | ICD-10-CM | POA: Diagnosis not present

## 2021-09-30 DIAGNOSIS — E875 Hyperkalemia: Secondary | ICD-10-CM | POA: Diagnosis not present

## 2021-09-30 DIAGNOSIS — R918 Other nonspecific abnormal finding of lung field: Secondary | ICD-10-CM | POA: Diagnosis not present

## 2021-09-30 DIAGNOSIS — J9621 Acute and chronic respiratory failure with hypoxia: Secondary | ICD-10-CM | POA: Diagnosis not present

## 2021-09-30 DIAGNOSIS — J90 Pleural effusion, not elsewhere classified: Secondary | ICD-10-CM | POA: Diagnosis not present

## 2021-09-30 DIAGNOSIS — N3000 Acute cystitis without hematuria: Secondary | ICD-10-CM | POA: Diagnosis not present

## 2021-09-30 DIAGNOSIS — E872 Acidosis, unspecified: Secondary | ICD-10-CM | POA: Diagnosis not present

## 2021-09-30 DIAGNOSIS — R6521 Severe sepsis with septic shock: Secondary | ICD-10-CM | POA: Diagnosis not present

## 2021-09-30 DIAGNOSIS — N179 Acute kidney failure, unspecified: Secondary | ICD-10-CM | POA: Diagnosis not present

## 2021-09-30 DIAGNOSIS — N170 Acute kidney failure with tubular necrosis: Secondary | ICD-10-CM | POA: Diagnosis not present

## 2021-09-30 DIAGNOSIS — M545 Low back pain, unspecified: Secondary | ICD-10-CM | POA: Diagnosis not present

## 2021-09-30 DIAGNOSIS — J189 Pneumonia, unspecified organism: Secondary | ICD-10-CM | POA: Diagnosis not present

## 2021-09-30 DIAGNOSIS — R579 Shock, unspecified: Secondary | ICD-10-CM | POA: Diagnosis not present

## 2021-09-30 DIAGNOSIS — K251 Acute gastric ulcer with perforation: Secondary | ICD-10-CM | POA: Diagnosis not present

## 2021-09-30 DIAGNOSIS — A419 Sepsis, unspecified organism: Secondary | ICD-10-CM | POA: Diagnosis not present

## 2021-10-01 DIAGNOSIS — J9621 Acute and chronic respiratory failure with hypoxia: Secondary | ICD-10-CM | POA: Diagnosis not present

## 2021-10-01 DIAGNOSIS — E872 Acidosis, unspecified: Secondary | ICD-10-CM | POA: Diagnosis not present

## 2021-10-01 DIAGNOSIS — N179 Acute kidney failure, unspecified: Secondary | ICD-10-CM | POA: Diagnosis not present

## 2021-10-01 DIAGNOSIS — A419 Sepsis, unspecified organism: Secondary | ICD-10-CM | POA: Diagnosis not present

## 2021-10-01 DIAGNOSIS — N3000 Acute cystitis without hematuria: Secondary | ICD-10-CM | POA: Diagnosis not present

## 2021-10-01 DIAGNOSIS — E871 Hypo-osmolality and hyponatremia: Secondary | ICD-10-CM | POA: Diagnosis not present

## 2021-10-01 DIAGNOSIS — J449 Chronic obstructive pulmonary disease, unspecified: Secondary | ICD-10-CM | POA: Diagnosis not present

## 2021-10-01 DIAGNOSIS — N170 Acute kidney failure with tubular necrosis: Secondary | ICD-10-CM | POA: Diagnosis not present

## 2021-10-01 DIAGNOSIS — E875 Hyperkalemia: Secondary | ICD-10-CM | POA: Diagnosis not present

## 2021-10-01 DIAGNOSIS — R6521 Severe sepsis with septic shock: Secondary | ICD-10-CM | POA: Diagnosis not present

## 2021-10-01 DIAGNOSIS — R579 Shock, unspecified: Secondary | ICD-10-CM | POA: Diagnosis not present

## 2021-10-01 DIAGNOSIS — J189 Pneumonia, unspecified organism: Secondary | ICD-10-CM | POA: Diagnosis not present

## 2021-10-02 DIAGNOSIS — N3000 Acute cystitis without hematuria: Secondary | ICD-10-CM | POA: Diagnosis not present

## 2021-10-02 DIAGNOSIS — R6521 Severe sepsis with septic shock: Secondary | ICD-10-CM | POA: Diagnosis not present

## 2021-10-02 DIAGNOSIS — N179 Acute kidney failure, unspecified: Secondary | ICD-10-CM | POA: Diagnosis not present

## 2021-10-02 DIAGNOSIS — A419 Sepsis, unspecified organism: Secondary | ICD-10-CM | POA: Diagnosis not present

## 2021-10-02 DIAGNOSIS — J449 Chronic obstructive pulmonary disease, unspecified: Secondary | ICD-10-CM | POA: Diagnosis not present

## 2021-10-02 DIAGNOSIS — J9621 Acute and chronic respiratory failure with hypoxia: Secondary | ICD-10-CM | POA: Diagnosis not present

## 2021-10-02 DIAGNOSIS — E871 Hypo-osmolality and hyponatremia: Secondary | ICD-10-CM | POA: Diagnosis not present

## 2021-10-02 DIAGNOSIS — E872 Acidosis, unspecified: Secondary | ICD-10-CM | POA: Diagnosis not present

## 2021-10-02 DIAGNOSIS — R579 Shock, unspecified: Secondary | ICD-10-CM | POA: Diagnosis not present

## 2021-10-02 DIAGNOSIS — J189 Pneumonia, unspecified organism: Secondary | ICD-10-CM | POA: Diagnosis not present

## 2021-10-02 DIAGNOSIS — N170 Acute kidney failure with tubular necrosis: Secondary | ICD-10-CM | POA: Diagnosis not present

## 2021-10-02 DIAGNOSIS — E875 Hyperkalemia: Secondary | ICD-10-CM | POA: Diagnosis not present

## 2021-10-03 DIAGNOSIS — J189 Pneumonia, unspecified organism: Secondary | ICD-10-CM | POA: Diagnosis not present

## 2021-10-03 DIAGNOSIS — J449 Chronic obstructive pulmonary disease, unspecified: Secondary | ICD-10-CM | POA: Diagnosis not present

## 2021-10-03 DIAGNOSIS — N3000 Acute cystitis without hematuria: Secondary | ICD-10-CM | POA: Diagnosis not present

## 2021-10-03 DIAGNOSIS — R932 Abnormal findings on diagnostic imaging of liver and biliary tract: Secondary | ICD-10-CM | POA: Diagnosis not present

## 2021-10-03 DIAGNOSIS — I2583 Coronary atherosclerosis due to lipid rich plaque: Secondary | ICD-10-CM | POA: Diagnosis not present

## 2021-10-03 DIAGNOSIS — G40909 Epilepsy, unspecified, not intractable, without status epilepticus: Secondary | ICD-10-CM | POA: Diagnosis not present

## 2021-10-03 DIAGNOSIS — N179 Acute kidney failure, unspecified: Secondary | ICD-10-CM | POA: Diagnosis not present

## 2021-10-03 DIAGNOSIS — I2510 Atherosclerotic heart disease of native coronary artery without angina pectoris: Secondary | ICD-10-CM | POA: Diagnosis not present

## 2021-10-03 DIAGNOSIS — J9621 Acute and chronic respiratory failure with hypoxia: Secondary | ICD-10-CM | POA: Diagnosis not present

## 2021-10-03 DIAGNOSIS — A419 Sepsis, unspecified organism: Secondary | ICD-10-CM | POA: Diagnosis not present

## 2021-10-03 DIAGNOSIS — N170 Acute kidney failure with tubular necrosis: Secondary | ICD-10-CM | POA: Diagnosis not present

## 2021-10-03 DIAGNOSIS — D751 Secondary polycythemia: Secondary | ICD-10-CM | POA: Diagnosis not present

## 2021-10-03 DIAGNOSIS — R6521 Severe sepsis with septic shock: Secondary | ICD-10-CM | POA: Diagnosis not present

## 2021-10-03 DIAGNOSIS — R579 Shock, unspecified: Secondary | ICD-10-CM | POA: Diagnosis not present

## 2021-10-03 DIAGNOSIS — E872 Acidosis, unspecified: Secondary | ICD-10-CM | POA: Diagnosis not present

## 2021-10-03 DIAGNOSIS — E875 Hyperkalemia: Secondary | ICD-10-CM | POA: Diagnosis not present

## 2021-10-03 DIAGNOSIS — E871 Hypo-osmolality and hyponatremia: Secondary | ICD-10-CM | POA: Diagnosis not present

## 2021-10-04 ENCOUNTER — Other Ambulatory Visit: Payer: Self-pay | Admitting: Physician Assistant

## 2021-10-04 DIAGNOSIS — D751 Secondary polycythemia: Secondary | ICD-10-CM | POA: Diagnosis not present

## 2021-10-04 DIAGNOSIS — E872 Acidosis, unspecified: Secondary | ICD-10-CM | POA: Diagnosis not present

## 2021-10-04 DIAGNOSIS — J9621 Acute and chronic respiratory failure with hypoxia: Secondary | ICD-10-CM | POA: Diagnosis not present

## 2021-10-04 DIAGNOSIS — R579 Shock, unspecified: Secondary | ICD-10-CM | POA: Diagnosis not present

## 2021-10-04 DIAGNOSIS — G40909 Epilepsy, unspecified, not intractable, without status epilepticus: Secondary | ICD-10-CM | POA: Diagnosis not present

## 2021-10-04 DIAGNOSIS — I2583 Coronary atherosclerosis due to lipid rich plaque: Secondary | ICD-10-CM | POA: Diagnosis not present

## 2021-10-04 DIAGNOSIS — N170 Acute kidney failure with tubular necrosis: Secondary | ICD-10-CM | POA: Diagnosis not present

## 2021-10-04 DIAGNOSIS — R6521 Severe sepsis with septic shock: Secondary | ICD-10-CM | POA: Diagnosis not present

## 2021-10-04 DIAGNOSIS — A419 Sepsis, unspecified organism: Secondary | ICD-10-CM | POA: Diagnosis not present

## 2021-10-04 DIAGNOSIS — M545 Low back pain, unspecified: Secondary | ICD-10-CM

## 2021-10-04 DIAGNOSIS — I2510 Atherosclerotic heart disease of native coronary artery without angina pectoris: Secondary | ICD-10-CM | POA: Diagnosis not present

## 2021-10-04 DIAGNOSIS — E875 Hyperkalemia: Secondary | ICD-10-CM | POA: Diagnosis not present

## 2021-10-04 DIAGNOSIS — E871 Hypo-osmolality and hyponatremia: Secondary | ICD-10-CM | POA: Diagnosis not present

## 2021-10-04 DIAGNOSIS — N3000 Acute cystitis without hematuria: Secondary | ICD-10-CM | POA: Diagnosis not present

## 2021-10-04 DIAGNOSIS — J449 Chronic obstructive pulmonary disease, unspecified: Secondary | ICD-10-CM | POA: Diagnosis not present

## 2021-10-04 DIAGNOSIS — N179 Acute kidney failure, unspecified: Secondary | ICD-10-CM | POA: Diagnosis not present

## 2021-10-04 DIAGNOSIS — J189 Pneumonia, unspecified organism: Secondary | ICD-10-CM | POA: Diagnosis not present

## 2021-10-05 DIAGNOSIS — J449 Chronic obstructive pulmonary disease, unspecified: Secondary | ICD-10-CM | POA: Diagnosis not present

## 2021-10-05 DIAGNOSIS — K668 Other specified disorders of peritoneum: Secondary | ICD-10-CM | POA: Diagnosis not present

## 2021-10-05 DIAGNOSIS — N3000 Acute cystitis without hematuria: Secondary | ICD-10-CM | POA: Diagnosis not present

## 2021-10-05 DIAGNOSIS — R600 Localized edema: Secondary | ICD-10-CM | POA: Diagnosis not present

## 2021-10-05 DIAGNOSIS — J9621 Acute and chronic respiratory failure with hypoxia: Secondary | ICD-10-CM | POA: Diagnosis not present

## 2021-10-05 DIAGNOSIS — J189 Pneumonia, unspecified organism: Secondary | ICD-10-CM | POA: Diagnosis not present

## 2021-10-05 DIAGNOSIS — E871 Hypo-osmolality and hyponatremia: Secondary | ICD-10-CM | POA: Diagnosis not present

## 2021-10-05 DIAGNOSIS — A419 Sepsis, unspecified organism: Secondary | ICD-10-CM | POA: Diagnosis not present

## 2021-10-05 DIAGNOSIS — Z9911 Dependence on respirator [ventilator] status: Secondary | ICD-10-CM | POA: Diagnosis not present

## 2021-10-05 DIAGNOSIS — I2510 Atherosclerotic heart disease of native coronary artery without angina pectoris: Secondary | ICD-10-CM | POA: Diagnosis not present

## 2021-10-05 DIAGNOSIS — D751 Secondary polycythemia: Secondary | ICD-10-CM | POA: Diagnosis not present

## 2021-10-05 DIAGNOSIS — R6521 Severe sepsis with septic shock: Secondary | ICD-10-CM | POA: Diagnosis not present

## 2021-10-05 DIAGNOSIS — J90 Pleural effusion, not elsewhere classified: Secondary | ICD-10-CM | POA: Diagnosis not present

## 2021-10-05 DIAGNOSIS — N170 Acute kidney failure with tubular necrosis: Secondary | ICD-10-CM | POA: Diagnosis not present

## 2021-10-05 DIAGNOSIS — R579 Shock, unspecified: Secondary | ICD-10-CM | POA: Diagnosis not present

## 2021-10-05 DIAGNOSIS — J9811 Atelectasis: Secondary | ICD-10-CM | POA: Diagnosis not present

## 2021-10-05 DIAGNOSIS — N179 Acute kidney failure, unspecified: Secondary | ICD-10-CM | POA: Diagnosis not present

## 2021-10-05 DIAGNOSIS — E872 Acidosis, unspecified: Secondary | ICD-10-CM | POA: Diagnosis not present

## 2021-10-05 DIAGNOSIS — E875 Hyperkalemia: Secondary | ICD-10-CM | POA: Diagnosis not present

## 2021-10-06 DIAGNOSIS — Z9911 Dependence on respirator [ventilator] status: Secondary | ICD-10-CM | POA: Diagnosis not present

## 2021-10-06 DIAGNOSIS — R579 Shock, unspecified: Secondary | ICD-10-CM | POA: Diagnosis not present

## 2021-10-06 DIAGNOSIS — A419 Sepsis, unspecified organism: Secondary | ICD-10-CM | POA: Diagnosis not present

## 2021-10-06 DIAGNOSIS — J90 Pleural effusion, not elsewhere classified: Secondary | ICD-10-CM | POA: Diagnosis not present

## 2021-10-06 DIAGNOSIS — I2510 Atherosclerotic heart disease of native coronary artery without angina pectoris: Secondary | ICD-10-CM | POA: Diagnosis not present

## 2021-10-06 DIAGNOSIS — K668 Other specified disorders of peritoneum: Secondary | ICD-10-CM | POA: Diagnosis not present

## 2021-10-06 DIAGNOSIS — Z4682 Encounter for fitting and adjustment of non-vascular catheter: Secondary | ICD-10-CM | POA: Diagnosis not present

## 2021-10-06 DIAGNOSIS — R6521 Severe sepsis with septic shock: Secondary | ICD-10-CM | POA: Diagnosis not present

## 2021-10-06 DIAGNOSIS — E871 Hypo-osmolality and hyponatremia: Secondary | ICD-10-CM | POA: Diagnosis not present

## 2021-10-06 DIAGNOSIS — R918 Other nonspecific abnormal finding of lung field: Secondary | ICD-10-CM | POA: Diagnosis not present

## 2021-10-06 DIAGNOSIS — J189 Pneumonia, unspecified organism: Secondary | ICD-10-CM | POA: Diagnosis not present

## 2021-10-06 DIAGNOSIS — J9621 Acute and chronic respiratory failure with hypoxia: Secondary | ICD-10-CM | POA: Diagnosis not present

## 2021-10-06 DIAGNOSIS — D751 Secondary polycythemia: Secondary | ICD-10-CM | POA: Diagnosis not present

## 2021-10-06 DIAGNOSIS — N179 Acute kidney failure, unspecified: Secondary | ICD-10-CM | POA: Diagnosis not present

## 2021-10-06 DIAGNOSIS — E875 Hyperkalemia: Secondary | ICD-10-CM | POA: Diagnosis not present

## 2021-10-06 DIAGNOSIS — N3000 Acute cystitis without hematuria: Secondary | ICD-10-CM | POA: Diagnosis not present

## 2021-10-06 DIAGNOSIS — J449 Chronic obstructive pulmonary disease, unspecified: Secondary | ICD-10-CM | POA: Diagnosis not present

## 2021-10-06 DIAGNOSIS — R600 Localized edema: Secondary | ICD-10-CM | POA: Diagnosis not present

## 2021-10-06 DIAGNOSIS — E872 Acidosis, unspecified: Secondary | ICD-10-CM | POA: Diagnosis not present

## 2021-10-06 DIAGNOSIS — N170 Acute kidney failure with tubular necrosis: Secondary | ICD-10-CM | POA: Diagnosis not present

## 2021-10-07 DIAGNOSIS — J449 Chronic obstructive pulmonary disease, unspecified: Secondary | ICD-10-CM | POA: Diagnosis not present

## 2021-10-07 DIAGNOSIS — E872 Acidosis, unspecified: Secondary | ICD-10-CM | POA: Diagnosis not present

## 2021-10-07 DIAGNOSIS — E871 Hypo-osmolality and hyponatremia: Secondary | ICD-10-CM | POA: Diagnosis not present

## 2021-10-07 DIAGNOSIS — I2510 Atherosclerotic heart disease of native coronary artery without angina pectoris: Secondary | ICD-10-CM | POA: Diagnosis not present

## 2021-10-07 DIAGNOSIS — J189 Pneumonia, unspecified organism: Secondary | ICD-10-CM | POA: Diagnosis not present

## 2021-10-07 DIAGNOSIS — R579 Shock, unspecified: Secondary | ICD-10-CM | POA: Diagnosis not present

## 2021-10-07 DIAGNOSIS — N179 Acute kidney failure, unspecified: Secondary | ICD-10-CM | POA: Diagnosis not present

## 2021-10-07 DIAGNOSIS — K668 Other specified disorders of peritoneum: Secondary | ICD-10-CM | POA: Diagnosis not present

## 2021-10-07 DIAGNOSIS — R6521 Severe sepsis with septic shock: Secondary | ICD-10-CM | POA: Diagnosis not present

## 2021-10-07 DIAGNOSIS — R600 Localized edema: Secondary | ICD-10-CM | POA: Diagnosis not present

## 2021-10-07 DIAGNOSIS — Z9911 Dependence on respirator [ventilator] status: Secondary | ICD-10-CM | POA: Diagnosis not present

## 2021-10-07 DIAGNOSIS — D751 Secondary polycythemia: Secondary | ICD-10-CM | POA: Diagnosis not present

## 2021-10-07 DIAGNOSIS — A419 Sepsis, unspecified organism: Secondary | ICD-10-CM | POA: Diagnosis not present

## 2021-10-07 DIAGNOSIS — J9621 Acute and chronic respiratory failure with hypoxia: Secondary | ICD-10-CM | POA: Diagnosis not present

## 2021-10-07 DIAGNOSIS — N3000 Acute cystitis without hematuria: Secondary | ICD-10-CM | POA: Diagnosis not present

## 2021-10-07 DIAGNOSIS — E875 Hyperkalemia: Secondary | ICD-10-CM | POA: Diagnosis not present

## 2021-10-07 DIAGNOSIS — N170 Acute kidney failure with tubular necrosis: Secondary | ICD-10-CM | POA: Diagnosis not present

## 2021-10-08 DIAGNOSIS — E875 Hyperkalemia: Secondary | ICD-10-CM | POA: Diagnosis not present

## 2021-10-08 DIAGNOSIS — J9621 Acute and chronic respiratory failure with hypoxia: Secondary | ICD-10-CM | POA: Diagnosis not present

## 2021-10-08 DIAGNOSIS — J449 Chronic obstructive pulmonary disease, unspecified: Secondary | ICD-10-CM | POA: Diagnosis not present

## 2021-10-08 DIAGNOSIS — N3000 Acute cystitis without hematuria: Secondary | ICD-10-CM | POA: Diagnosis not present

## 2021-10-08 DIAGNOSIS — E871 Hypo-osmolality and hyponatremia: Secondary | ICD-10-CM | POA: Diagnosis not present

## 2021-10-08 DIAGNOSIS — N170 Acute kidney failure with tubular necrosis: Secondary | ICD-10-CM | POA: Diagnosis not present

## 2021-10-08 DIAGNOSIS — I2510 Atherosclerotic heart disease of native coronary artery without angina pectoris: Secondary | ICD-10-CM | POA: Diagnosis not present

## 2021-10-08 DIAGNOSIS — N179 Acute kidney failure, unspecified: Secondary | ICD-10-CM | POA: Diagnosis not present

## 2021-10-08 DIAGNOSIS — K668 Other specified disorders of peritoneum: Secondary | ICD-10-CM | POA: Diagnosis not present

## 2021-10-08 DIAGNOSIS — I081 Rheumatic disorders of both mitral and tricuspid valves: Secondary | ICD-10-CM | POA: Diagnosis not present

## 2021-10-08 DIAGNOSIS — D751 Secondary polycythemia: Secondary | ICD-10-CM | POA: Diagnosis not present

## 2021-10-08 DIAGNOSIS — Z9911 Dependence on respirator [ventilator] status: Secondary | ICD-10-CM | POA: Diagnosis not present

## 2021-10-08 DIAGNOSIS — R600 Localized edema: Secondary | ICD-10-CM | POA: Diagnosis not present

## 2021-10-08 DIAGNOSIS — E872 Acidosis, unspecified: Secondary | ICD-10-CM | POA: Diagnosis not present

## 2021-10-09 DIAGNOSIS — K668 Other specified disorders of peritoneum: Secondary | ICD-10-CM | POA: Diagnosis not present

## 2021-10-09 DIAGNOSIS — I2510 Atherosclerotic heart disease of native coronary artery without angina pectoris: Secondary | ICD-10-CM | POA: Diagnosis not present

## 2021-10-09 DIAGNOSIS — J449 Chronic obstructive pulmonary disease, unspecified: Secondary | ICD-10-CM | POA: Diagnosis not present

## 2021-10-09 DIAGNOSIS — E871 Hypo-osmolality and hyponatremia: Secondary | ICD-10-CM | POA: Diagnosis not present

## 2021-10-09 DIAGNOSIS — D751 Secondary polycythemia: Secondary | ICD-10-CM | POA: Diagnosis not present

## 2021-10-09 DIAGNOSIS — E872 Acidosis, unspecified: Secondary | ICD-10-CM | POA: Diagnosis not present

## 2021-10-09 DIAGNOSIS — Z9911 Dependence on respirator [ventilator] status: Secondary | ICD-10-CM | POA: Diagnosis not present

## 2021-10-09 DIAGNOSIS — N3000 Acute cystitis without hematuria: Secondary | ICD-10-CM | POA: Diagnosis not present

## 2021-10-09 DIAGNOSIS — R600 Localized edema: Secondary | ICD-10-CM | POA: Diagnosis not present

## 2021-10-09 DIAGNOSIS — N170 Acute kidney failure with tubular necrosis: Secondary | ICD-10-CM | POA: Diagnosis not present

## 2021-10-09 DIAGNOSIS — N179 Acute kidney failure, unspecified: Secondary | ICD-10-CM | POA: Diagnosis not present

## 2021-10-09 DIAGNOSIS — J9621 Acute and chronic respiratory failure with hypoxia: Secondary | ICD-10-CM | POA: Diagnosis not present

## 2021-10-09 DIAGNOSIS — E875 Hyperkalemia: Secondary | ICD-10-CM | POA: Diagnosis not present

## 2021-10-10 DIAGNOSIS — R109 Unspecified abdominal pain: Secondary | ICD-10-CM | POA: Diagnosis not present

## 2021-10-10 DIAGNOSIS — N3080 Other cystitis without hematuria: Secondary | ICD-10-CM | POA: Diagnosis not present

## 2021-10-10 DIAGNOSIS — N3000 Acute cystitis without hematuria: Secondary | ICD-10-CM | POA: Diagnosis not present

## 2021-10-10 DIAGNOSIS — R579 Shock, unspecified: Secondary | ICD-10-CM | POA: Diagnosis not present

## 2021-10-10 DIAGNOSIS — J449 Chronic obstructive pulmonary disease, unspecified: Secondary | ICD-10-CM | POA: Diagnosis not present

## 2021-10-10 DIAGNOSIS — J9621 Acute and chronic respiratory failure with hypoxia: Secondary | ICD-10-CM | POA: Diagnosis not present

## 2021-10-10 DIAGNOSIS — N170 Acute kidney failure with tubular necrosis: Secondary | ICD-10-CM | POA: Diagnosis not present

## 2021-10-10 DIAGNOSIS — N179 Acute kidney failure, unspecified: Secondary | ICD-10-CM | POA: Diagnosis not present

## 2021-10-11 DIAGNOSIS — N170 Acute kidney failure with tubular necrosis: Secondary | ICD-10-CM | POA: Diagnosis not present

## 2021-10-11 DIAGNOSIS — J449 Chronic obstructive pulmonary disease, unspecified: Secondary | ICD-10-CM | POA: Diagnosis not present

## 2021-10-11 DIAGNOSIS — N3000 Acute cystitis without hematuria: Secondary | ICD-10-CM | POA: Diagnosis not present

## 2021-10-11 DIAGNOSIS — N179 Acute kidney failure, unspecified: Secondary | ICD-10-CM | POA: Diagnosis not present

## 2021-10-11 DIAGNOSIS — R579 Shock, unspecified: Secondary | ICD-10-CM | POA: Diagnosis not present

## 2021-10-11 DIAGNOSIS — N3080 Other cystitis without hematuria: Secondary | ICD-10-CM | POA: Diagnosis not present

## 2021-10-11 DIAGNOSIS — J9621 Acute and chronic respiratory failure with hypoxia: Secondary | ICD-10-CM | POA: Diagnosis not present

## 2021-10-12 DIAGNOSIS — N170 Acute kidney failure with tubular necrosis: Secondary | ICD-10-CM | POA: Diagnosis not present

## 2021-10-12 DIAGNOSIS — J449 Chronic obstructive pulmonary disease, unspecified: Secondary | ICD-10-CM | POA: Diagnosis not present

## 2021-10-12 DIAGNOSIS — N3080 Other cystitis without hematuria: Secondary | ICD-10-CM | POA: Diagnosis not present

## 2021-10-12 DIAGNOSIS — R579 Shock, unspecified: Secondary | ICD-10-CM | POA: Diagnosis not present

## 2021-10-12 DIAGNOSIS — N179 Acute kidney failure, unspecified: Secondary | ICD-10-CM | POA: Diagnosis not present

## 2021-10-12 DIAGNOSIS — N3000 Acute cystitis without hematuria: Secondary | ICD-10-CM | POA: Diagnosis not present

## 2021-10-12 DIAGNOSIS — J9621 Acute and chronic respiratory failure with hypoxia: Secondary | ICD-10-CM | POA: Diagnosis not present

## 2021-10-13 DIAGNOSIS — J9621 Acute and chronic respiratory failure with hypoxia: Secondary | ICD-10-CM | POA: Diagnosis not present

## 2021-10-13 DIAGNOSIS — J449 Chronic obstructive pulmonary disease, unspecified: Secondary | ICD-10-CM | POA: Diagnosis not present

## 2021-10-13 DIAGNOSIS — J90 Pleural effusion, not elsewhere classified: Secondary | ICD-10-CM | POA: Diagnosis not present

## 2021-10-13 DIAGNOSIS — Z4682 Encounter for fitting and adjustment of non-vascular catheter: Secondary | ICD-10-CM | POA: Diagnosis not present

## 2021-10-13 DIAGNOSIS — R918 Other nonspecific abnormal finding of lung field: Secondary | ICD-10-CM | POA: Diagnosis not present

## 2021-10-13 DIAGNOSIS — N3080 Other cystitis without hematuria: Secondary | ICD-10-CM | POA: Diagnosis not present

## 2021-10-13 DIAGNOSIS — R579 Shock, unspecified: Secondary | ICD-10-CM | POA: Diagnosis not present

## 2021-10-13 DIAGNOSIS — N179 Acute kidney failure, unspecified: Secondary | ICD-10-CM | POA: Diagnosis not present

## 2021-10-13 DIAGNOSIS — N170 Acute kidney failure with tubular necrosis: Secondary | ICD-10-CM | POA: Diagnosis not present

## 2021-10-13 DIAGNOSIS — N3000 Acute cystitis without hematuria: Secondary | ICD-10-CM | POA: Diagnosis not present

## 2021-10-14 DIAGNOSIS — J449 Chronic obstructive pulmonary disease, unspecified: Secondary | ICD-10-CM | POA: Diagnosis not present

## 2021-10-14 DIAGNOSIS — J9621 Acute and chronic respiratory failure with hypoxia: Secondary | ICD-10-CM | POA: Diagnosis not present

## 2021-10-14 DIAGNOSIS — N3000 Acute cystitis without hematuria: Secondary | ICD-10-CM | POA: Diagnosis not present

## 2021-10-14 DIAGNOSIS — K5730 Diverticulosis of large intestine without perforation or abscess without bleeding: Secondary | ICD-10-CM | POA: Diagnosis not present

## 2021-10-14 DIAGNOSIS — N170 Acute kidney failure with tubular necrosis: Secondary | ICD-10-CM | POA: Diagnosis not present

## 2021-10-14 DIAGNOSIS — N3080 Other cystitis without hematuria: Secondary | ICD-10-CM | POA: Diagnosis not present

## 2021-10-14 DIAGNOSIS — K6389 Other specified diseases of intestine: Secondary | ICD-10-CM | POA: Diagnosis not present

## 2021-10-14 DIAGNOSIS — Z90710 Acquired absence of both cervix and uterus: Secondary | ICD-10-CM | POA: Diagnosis not present

## 2021-10-14 DIAGNOSIS — R579 Shock, unspecified: Secondary | ICD-10-CM | POA: Diagnosis not present

## 2021-10-14 DIAGNOSIS — N179 Acute kidney failure, unspecified: Secondary | ICD-10-CM | POA: Diagnosis not present

## 2021-10-15 DIAGNOSIS — R1909 Other intra-abdominal and pelvic swelling, mass and lump: Secondary | ICD-10-CM | POA: Diagnosis not present

## 2021-10-15 DIAGNOSIS — N3080 Other cystitis without hematuria: Secondary | ICD-10-CM | POA: Diagnosis not present

## 2021-10-15 DIAGNOSIS — N179 Acute kidney failure, unspecified: Secondary | ICD-10-CM | POA: Diagnosis not present

## 2021-10-15 DIAGNOSIS — J449 Chronic obstructive pulmonary disease, unspecified: Secondary | ICD-10-CM | POA: Diagnosis not present

## 2021-10-15 DIAGNOSIS — J9621 Acute and chronic respiratory failure with hypoxia: Secondary | ICD-10-CM | POA: Diagnosis not present

## 2021-10-15 DIAGNOSIS — N3000 Acute cystitis without hematuria: Secondary | ICD-10-CM | POA: Diagnosis not present

## 2021-10-15 DIAGNOSIS — R579 Shock, unspecified: Secondary | ICD-10-CM | POA: Diagnosis not present

## 2021-10-15 DIAGNOSIS — N170 Acute kidney failure with tubular necrosis: Secondary | ICD-10-CM | POA: Diagnosis not present

## 2021-10-16 DIAGNOSIS — J449 Chronic obstructive pulmonary disease, unspecified: Secondary | ICD-10-CM | POA: Diagnosis not present

## 2021-10-16 DIAGNOSIS — N170 Acute kidney failure with tubular necrosis: Secondary | ICD-10-CM | POA: Diagnosis not present

## 2021-10-16 DIAGNOSIS — N3080 Other cystitis without hematuria: Secondary | ICD-10-CM | POA: Diagnosis not present

## 2021-10-16 DIAGNOSIS — R579 Shock, unspecified: Secondary | ICD-10-CM | POA: Diagnosis not present

## 2021-10-16 DIAGNOSIS — K659 Peritonitis, unspecified: Secondary | ICD-10-CM | POA: Diagnosis not present

## 2021-10-16 DIAGNOSIS — N3000 Acute cystitis without hematuria: Secondary | ICD-10-CM | POA: Diagnosis not present

## 2021-10-16 DIAGNOSIS — K255 Chronic or unspecified gastric ulcer with perforation: Secondary | ICD-10-CM | POA: Diagnosis not present

## 2021-10-16 DIAGNOSIS — N179 Acute kidney failure, unspecified: Secondary | ICD-10-CM | POA: Diagnosis not present

## 2021-10-16 DIAGNOSIS — J9621 Acute and chronic respiratory failure with hypoxia: Secondary | ICD-10-CM | POA: Diagnosis not present

## 2021-10-17 DIAGNOSIS — N179 Acute kidney failure, unspecified: Secondary | ICD-10-CM | POA: Diagnosis not present

## 2021-10-17 DIAGNOSIS — K668 Other specified disorders of peritoneum: Secondary | ICD-10-CM | POA: Diagnosis not present

## 2021-10-17 DIAGNOSIS — I3139 Other pericardial effusion (noninflammatory): Secondary | ICD-10-CM | POA: Diagnosis not present

## 2021-10-17 DIAGNOSIS — K659 Peritonitis, unspecified: Secondary | ICD-10-CM | POA: Diagnosis not present

## 2021-10-17 DIAGNOSIS — K255 Chronic or unspecified gastric ulcer with perforation: Secondary | ICD-10-CM | POA: Diagnosis not present

## 2021-10-17 DIAGNOSIS — J449 Chronic obstructive pulmonary disease, unspecified: Secondary | ICD-10-CM | POA: Diagnosis not present

## 2021-10-17 DIAGNOSIS — R911 Solitary pulmonary nodule: Secondary | ICD-10-CM | POA: Diagnosis not present

## 2021-10-17 DIAGNOSIS — N3000 Acute cystitis without hematuria: Secondary | ICD-10-CM | POA: Diagnosis not present

## 2021-10-17 DIAGNOSIS — E871 Hypo-osmolality and hyponatremia: Secondary | ICD-10-CM | POA: Diagnosis not present

## 2021-10-17 DIAGNOSIS — J9621 Acute and chronic respiratory failure with hypoxia: Secondary | ICD-10-CM | POA: Diagnosis not present

## 2021-10-17 DIAGNOSIS — R5382 Chronic fatigue, unspecified: Secondary | ICD-10-CM | POA: Diagnosis not present

## 2021-10-17 DIAGNOSIS — R579 Shock, unspecified: Secondary | ICD-10-CM | POA: Diagnosis not present

## 2021-10-17 DIAGNOSIS — K6389 Other specified diseases of intestine: Secondary | ICD-10-CM | POA: Diagnosis not present

## 2021-10-17 DIAGNOSIS — I2510 Atherosclerotic heart disease of native coronary artery without angina pectoris: Secondary | ICD-10-CM | POA: Diagnosis not present

## 2021-10-17 DIAGNOSIS — R600 Localized edema: Secondary | ICD-10-CM | POA: Diagnosis not present

## 2021-10-17 DIAGNOSIS — J189 Pneumonia, unspecified organism: Secondary | ICD-10-CM | POA: Diagnosis not present

## 2021-10-17 DIAGNOSIS — N170 Acute kidney failure with tubular necrosis: Secondary | ICD-10-CM | POA: Diagnosis not present

## 2021-10-17 DIAGNOSIS — F17200 Nicotine dependence, unspecified, uncomplicated: Secondary | ICD-10-CM | POA: Diagnosis not present

## 2021-10-17 DIAGNOSIS — J90 Pleural effusion, not elsewhere classified: Secondary | ICD-10-CM | POA: Diagnosis not present

## 2021-10-17 DIAGNOSIS — J9811 Atelectasis: Secondary | ICD-10-CM | POA: Diagnosis not present

## 2021-10-17 DIAGNOSIS — N289 Disorder of kidney and ureter, unspecified: Secondary | ICD-10-CM | POA: Diagnosis not present

## 2021-10-17 DIAGNOSIS — M545 Low back pain, unspecified: Secondary | ICD-10-CM | POA: Diagnosis not present

## 2021-10-18 DIAGNOSIS — N3080 Other cystitis without hematuria: Secondary | ICD-10-CM | POA: Diagnosis not present

## 2021-10-18 DIAGNOSIS — I2510 Atherosclerotic heart disease of native coronary artery without angina pectoris: Secondary | ICD-10-CM | POA: Diagnosis not present

## 2021-10-18 DIAGNOSIS — J9621 Acute and chronic respiratory failure with hypoxia: Secondary | ICD-10-CM | POA: Diagnosis not present

## 2021-10-18 DIAGNOSIS — J449 Chronic obstructive pulmonary disease, unspecified: Secondary | ICD-10-CM | POA: Diagnosis not present

## 2021-10-18 DIAGNOSIS — R579 Shock, unspecified: Secondary | ICD-10-CM | POA: Diagnosis not present

## 2021-10-18 DIAGNOSIS — N179 Acute kidney failure, unspecified: Secondary | ICD-10-CM | POA: Diagnosis not present

## 2021-10-18 DIAGNOSIS — R6521 Severe sepsis with septic shock: Secondary | ICD-10-CM | POA: Diagnosis not present

## 2021-10-18 DIAGNOSIS — K255 Chronic or unspecified gastric ulcer with perforation: Secondary | ICD-10-CM | POA: Diagnosis not present

## 2021-10-18 DIAGNOSIS — J189 Pneumonia, unspecified organism: Secondary | ICD-10-CM | POA: Diagnosis not present

## 2021-10-18 DIAGNOSIS — R5382 Chronic fatigue, unspecified: Secondary | ICD-10-CM | POA: Diagnosis not present

## 2021-10-18 DIAGNOSIS — N3000 Acute cystitis without hematuria: Secondary | ICD-10-CM | POA: Diagnosis not present

## 2021-10-18 DIAGNOSIS — R600 Localized edema: Secondary | ICD-10-CM | POA: Diagnosis not present

## 2021-10-18 DIAGNOSIS — N170 Acute kidney failure with tubular necrosis: Secondary | ICD-10-CM | POA: Diagnosis not present

## 2021-10-18 DIAGNOSIS — R69 Illness, unspecified: Secondary | ICD-10-CM | POA: Diagnosis not present

## 2021-10-18 DIAGNOSIS — Z515 Encounter for palliative care: Secondary | ICD-10-CM | POA: Diagnosis not present

## 2021-10-18 DIAGNOSIS — K659 Peritonitis, unspecified: Secondary | ICD-10-CM | POA: Diagnosis not present

## 2021-10-18 DIAGNOSIS — R110 Nausea: Secondary | ICD-10-CM | POA: Diagnosis not present

## 2021-10-18 DIAGNOSIS — E871 Hypo-osmolality and hyponatremia: Secondary | ICD-10-CM | POA: Diagnosis not present

## 2021-10-18 DIAGNOSIS — M545 Low back pain, unspecified: Secondary | ICD-10-CM | POA: Diagnosis not present

## 2021-10-18 DIAGNOSIS — F17200 Nicotine dependence, unspecified, uncomplicated: Secondary | ICD-10-CM | POA: Diagnosis not present

## 2021-10-18 DIAGNOSIS — Z7189 Other specified counseling: Secondary | ICD-10-CM | POA: Diagnosis not present

## 2021-10-18 DIAGNOSIS — A419 Sepsis, unspecified organism: Secondary | ICD-10-CM | POA: Diagnosis not present

## 2021-10-18 DIAGNOSIS — R52 Pain, unspecified: Secondary | ICD-10-CM | POA: Diagnosis not present

## 2021-10-19 DIAGNOSIS — A419 Sepsis, unspecified organism: Secondary | ICD-10-CM | POA: Diagnosis not present

## 2021-10-19 DIAGNOSIS — R6521 Severe sepsis with septic shock: Secondary | ICD-10-CM | POA: Diagnosis not present

## 2021-10-19 DIAGNOSIS — J449 Chronic obstructive pulmonary disease, unspecified: Secondary | ICD-10-CM | POA: Diagnosis not present

## 2021-10-19 DIAGNOSIS — Z515 Encounter for palliative care: Secondary | ICD-10-CM | POA: Diagnosis not present

## 2021-10-19 DIAGNOSIS — J9621 Acute and chronic respiratory failure with hypoxia: Secondary | ICD-10-CM | POA: Diagnosis not present

## 2021-10-19 DIAGNOSIS — N170 Acute kidney failure with tubular necrosis: Secondary | ICD-10-CM | POA: Diagnosis not present

## 2021-11-01 DEATH — deceased

## 2021-11-04 ENCOUNTER — Telehealth (HOSPITAL_COMMUNITY): Payer: Self-pay | Admitting: Emergency Medicine

## 2021-11-04 NOTE — Telephone Encounter (Signed)
Attempted to call patient regarding upcoming cardiac PET appointment. Left message on voicemail with name and callback number Tyrick Dunagan RN Navigator Cardiac Imaging [facility] [phone] Office [phone] Cell  

## 2021-11-07 ENCOUNTER — Other Ambulatory Visit: Payer: Self-pay | Admitting: *Deleted

## 2021-11-07 DIAGNOSIS — R0609 Other forms of dyspnea: Secondary | ICD-10-CM

## 2021-11-08 ENCOUNTER — Ambulatory Visit (HOSPITAL_COMMUNITY): Admission: RE | Admit: 2021-11-08 | Payer: Medicare HMO | Source: Ambulatory Visit

## 2021-12-28 ENCOUNTER — Encounter: Payer: Self-pay | Admitting: Acute Care

## 2022-02-10 NOTE — Progress Notes (Deleted)
HPI: FU CAD.  Previous chest CT showed a pulmonary nodule in the right upper lobe.  She was also noted to have coronary calcification in the LAD as well as aortic atherosclerosis and emphysema.  PET scan ordered at last office visit but not performed.  Recently admitted at [facility] with sepsis.  Patient was found to have perforated gastric antrum and emphysematous cystitis.  Patient required intubation and pressor support.  She developed acute renal failure requiring dialysis.  Patient was felt to have a poor prognosis and was placed on hospice care and no CODE BLUE.  Echocardiogram July 2023 at [facility] showed normal LV function, mild tricuspid regurgitation.  Since last seen,   Current Outpatient Medications  Medication Sig Dispense Refill   albuterol (VENTOLIN HFA) 108 (90 Base) MCG/ACT inhaler INHALE 2 PUFFS BY MOUTH EVERY 6 HOURS AS NEEDED FOR WHEEZING 17 each 5   AMBULATORY NON FORMULARY MEDICATION shingrx 2 doses to prevent shingles. 2 application 0   AMBULATORY NON FORMULARY MEDICATION shingrx 2 doses to prevent shingles. 2 application 0   aspirin EC 81 MG tablet Take 1 tablet (81 mg total) by mouth daily. Swallow whole. 90 tablet 3   DULoxetine (CYMBALTA) 30 MG capsule TAKE 1 CAPSULE (30 MG TOTAL) BY MOUTH DAILY. FOR CHRONIC PAIN. 90 capsule 0   Fluticasone-Umeclidin-Vilant (TRELEGY ELLIPTA) 100-62.5-25 MCG/ACT AEPB INHALE ONE PUFF INTO LUNGS EVERY DAY 180 each 1   ipratropium-albuterol (DUONEB) 0.5-2.5 (3) MG/3ML SOLN Take 3 mLs by nebulization every 4 (four) hours as needed. 360 mL 0   ketoconazole (NIZORAL) 2 % cream Apply 1 application topically daily. 60 g 1   losartan-hydrochlorothiazide (HYZAAR) 50-12.5 MG tablet TAKE 1 TABLET BY MOUTH EVERY DAY 90 tablet 1   meloxicam (MOBIC) 15 MG tablet TAKE 1 TABLET (15 MG TOTAL) BY MOUTH DAILY. 90 tablet 1   montelukast (SINGULAIR) 10 MG tablet Take 1 tablet (10 mg total) by mouth at bedtime. 90 tablet 3   rosuvastatin (CRESTOR) 40 MG tablet  Take 1 tablet (40 mg total) by mouth daily. 90 tablet 3   sertraline (ZOLOFT) 100 MG tablet TAKE 1 TABLET BY MOUTH EVERY DAY 90 tablet 1   solifenacin (VESICARE) 5 MG tablet TAKE 1 TABLET (5 MG TOTAL) BY MOUTH DAILY. 90 tablet 1   traZODone (DESYREL) 50 MG tablet TAKE 0.5-1 TABLETS BY MOUTH AT BEDTIME AS NEEDED FOR SLEEP. 90 tablet 0   No current facility-administered medications for this visit.     Past Medical History:  Diagnosis Date   Anxiety    Asthma    COPD (chronic obstructive pulmonary disease) ([place])    Herpes    Hypercholesteremia    Hypertension    Murmur    Seizure-like activity ([place])     Past Surgical History:  Procedure Laterality Date   PARTIAL HYSTERECTOMY     right wrist surgery     TONSILLECTOMY AND ADENOIDECTOMY     WISDOM TOOTH EXTRACTION      Social History   Socioeconomic History   Marital status: Divorced    Spouse name: Not on file   Number of children: 2   Years of education: Not on file   Highest education level: Not on file  Occupational History   Not on file  Tobacco Use   Smoking status: Every Day    Packs/day: 1.00    Types: Cigarettes   Smokeless tobacco: Never  Substance and Sexual Activity   Alcohol use: Yes    Comment:  Rare   Drug use: Never   Sexual activity: Not Currently  Other Topics Concern   Not on file  Social History Narrative   Not on file   Social Determinants of Health   Financial Resource Strain: Not on file  Food Insecurity: Not on file  Transportation Needs: Not on file  Physical Activity: Not on file  Stress: Not on file  Social Connections: Not on file  Intimate Partner Violence: Not on file    Family History  Problem Relation Age of Onset   Stomach cancer Mother    Brain cancer Mother    Hypertension Mother    Hypercholesterolemia Mother    Heart attack Father    Hypercholesterolemia Father    Diabetes Father    Hypertension Father    Liver disease Sister    Heart attack Maternal Grandfather     Brain cancer Maternal Grandfather    Breast cancer Maternal Aunt     ROS: no fevers or chills, productive cough, hemoptysis, dysphasia, odynophagia, melena, hematochezia, dysuria, hematuria, rash, seizure activity, orthopnea, PND, pedal edema, claudication. Remaining systems are negative.  Physical Exam: Well-developed well-nourished in no acute distress.  Skin is warm and dry.  HEENT is normal.  Neck is supple.  Chest is clear to auscultation with normal expansion.  Cardiovascular exam is regular rate and rhythm.  Abdominal exam nontender or distended. No masses palpated. Extremities show no edema. neuro grossly intact  ECG- personally reviewed  A/P  1 coronary calcification-  2 recent admission for sepsis, acute renal failure, perforated gastric antrum and emphysematous cystitis-patient made no CODE BLUE and is on hospice care.  3 hypertension-  4 hyperlipidemia-  Kirk Ruths, MD

## 2022-02-22 ENCOUNTER — Ambulatory Visit: Payer: Medicare HMO | Admitting: Cardiology

## 2022-03-15 IMAGING — CT CT CHEST LUNG CANCER SCREENING LOW DOSE W/O CM
1 series · 10 of 10 positions shown, 13 images · non-contrast
Comparison: CT chest dated October 15, 2019

CLINICAL DATA: Current smoker with 47 pack-year history



[ct lung segmentation data · axial · 0.81mm/px · z∈[-355,-355]mm · 10 of 303 frames shown]
[frame 1/303  mediastinal]
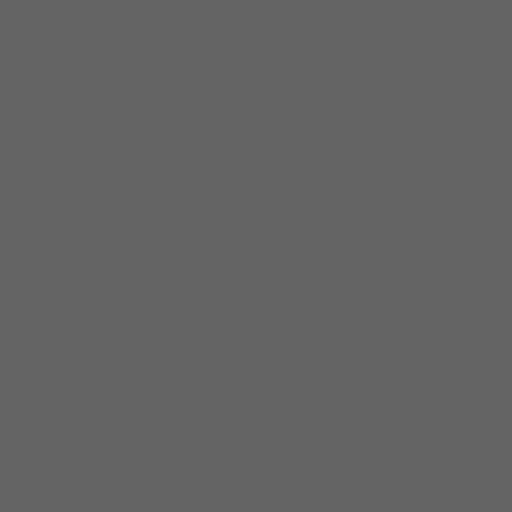
[frame 1/303  lung]
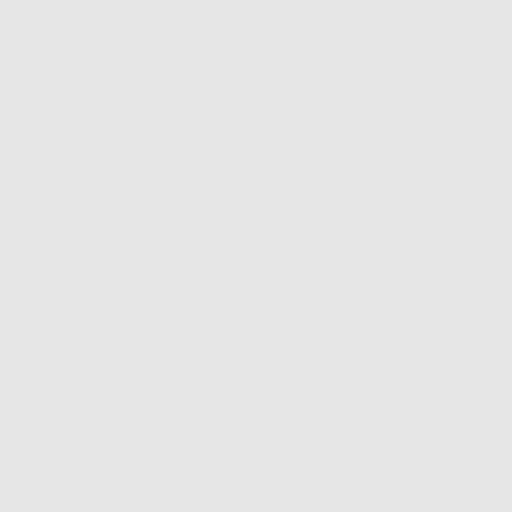
[frame 34/303  lung]
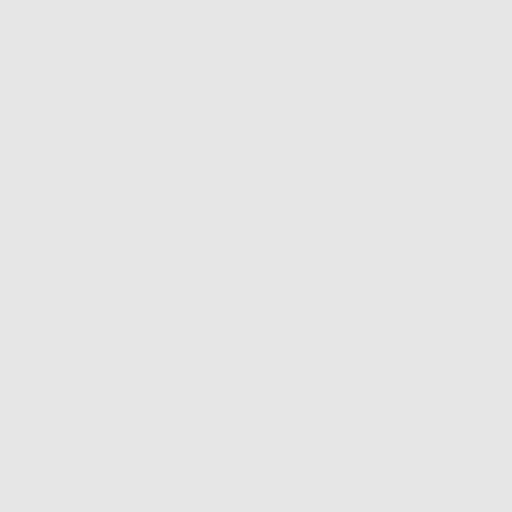
[frame 68/303  lung]
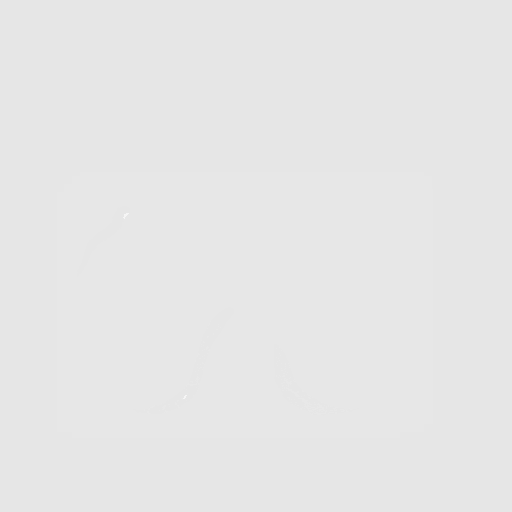
[frame 101/303  lung]
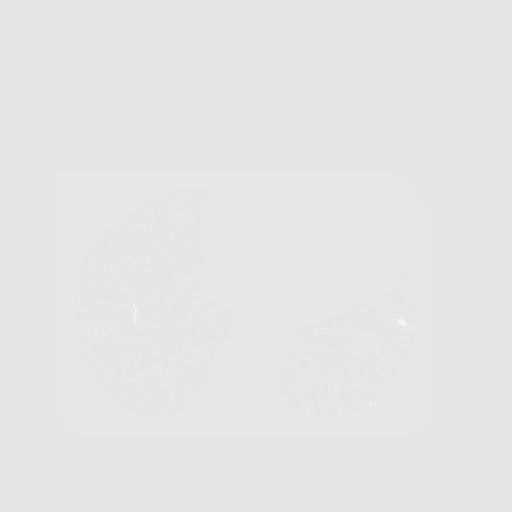
[frame 135/303  mediastinal]
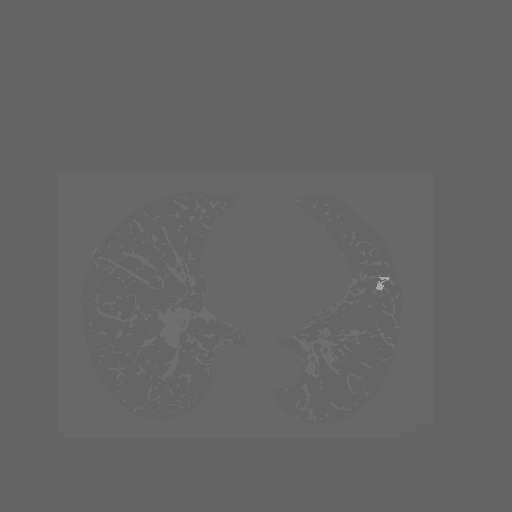
[frame 135/303  lung]
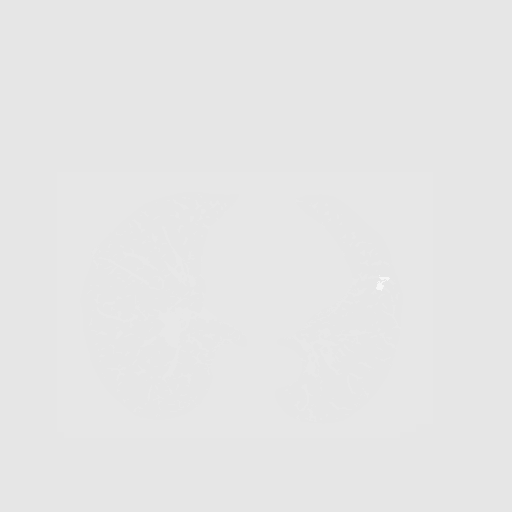
[frame 168/303  lung]
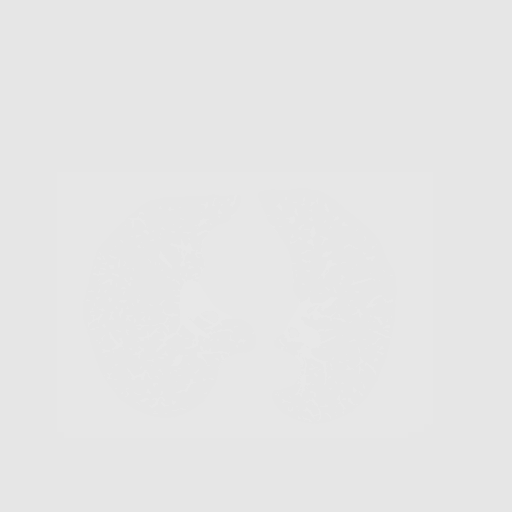
[frame 202/303  lung]
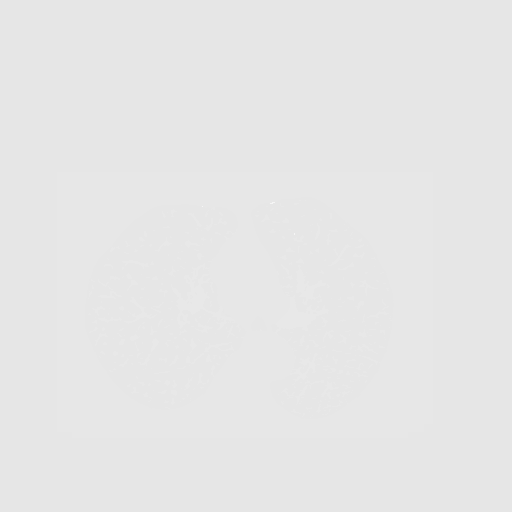
[frame 235/303  lung]
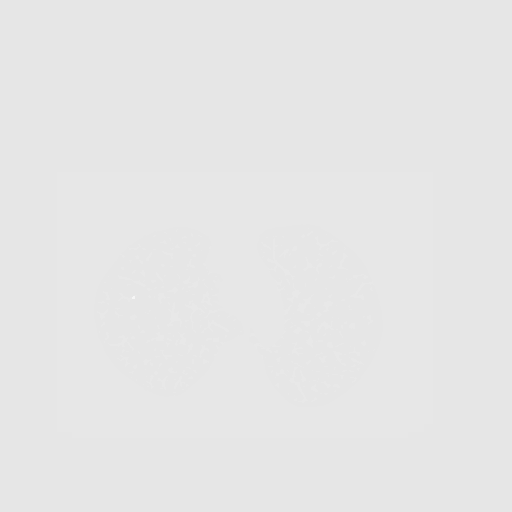
[frame 269/303  mediastinal]
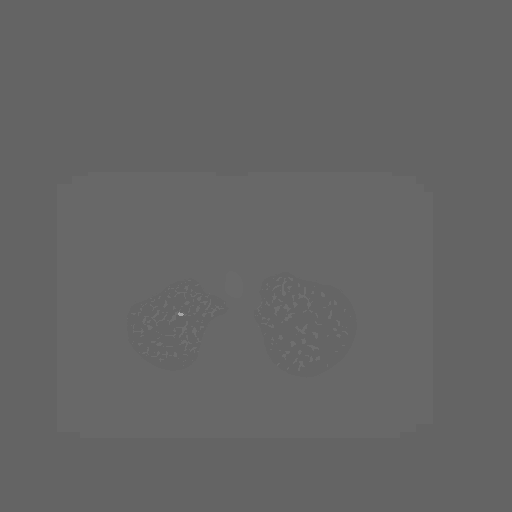
[frame 269/303  lung]
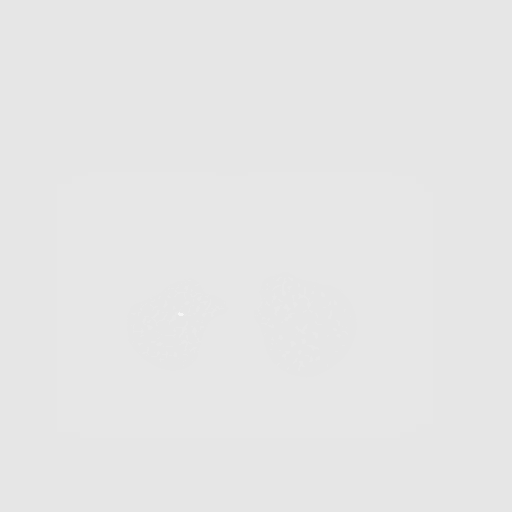
[frame 303/303  lung]
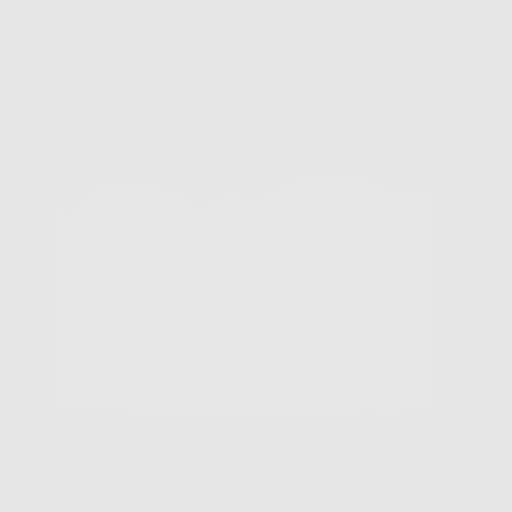

[10 of 10 positions shown; findings below may reference images not displayed]

FINDINGS: Cardiovascular: Normal heart size. No pericardial effusion. Coronary
artery calcifications of the LAD. Atherosclerotic disease of the
thoracic aorta.

Mediastinum/Nodes: Esophagus and thyroid are unremarkable. No
pathologically enlarged lymph nodes seen in the chest.

Lungs/Pleura: Central airways are patent. Centrilobular emphysema.
No consolidation, pleural effusion or pneumothorax. Linear lingular
opacity, likely due to scarring or atelectasis. Small solid and non
solid pulmonary nodules. Reference solid nodule of the right upper
lobe measuring 4.5 mm in mean diameter located on image 98.
Additional small ground-glass nodules are seen.

Upper Abdomen: No acute abnormality.

Musculoskeletal: No chest wall mass or suspicious bone lesions
identified.
IMPRESSION: 1. Solid pulmonary nodule of the right upper lobe measuring 4.5 mm,
new when compared with October 15, 2019 prior. Lung-RADS 3, probably
benign findings. Short-term follow-up in 6 months is recommended
with repeat low-dose chest CT without contrast (please use the
following order, CT CHEST LCS NODULE FOLLOW-UP W/O CM). S modifier
for coronary artery calcifications.
2. Coronary artery calcifications of the LAD, recommend ASCVD risk
assessment.
3. Aortic Atherosclerosis (BSIQ0-LQF.F) and Emphysema (BSIQ0-FZ1.J).

## 2022-04-12 ENCOUNTER — Telehealth: Payer: Self-pay | Admitting: *Deleted

## 2022-04-12 NOTE — Telephone Encounter (Signed)
Left message for patient to call back to schedule follow up lung cancer screening CT scan.  

## 2022-05-24 ENCOUNTER — Encounter: Payer: Self-pay | Admitting: *Deleted
